# Patient Record
Sex: Male | Born: 1950 | Hispanic: No | Marital: Married | Smoking: Former smoker
Health system: Southern US, Community
[De-identification: ages and names within clinical notes are randomized; demographics above are authoritative.]

## PROBLEM LIST (undated history)

## (undated) DIAGNOSIS — C801 Malignant (primary) neoplasm, unspecified: Secondary | ICD-10-CM

## (undated) DIAGNOSIS — C61 Malignant neoplasm of prostate: Secondary | ICD-10-CM

---

## 1999-04-20 ENCOUNTER — Emergency Department (HOSPITAL_COMMUNITY): Admission: EM | Admit: 1999-04-20 | Discharge: 1999-04-20 | Payer: Self-pay | Admitting: Internal Medicine

## 2000-02-25 ENCOUNTER — Encounter: Admission: RE | Admit: 2000-02-25 | Discharge: 2000-02-25 | Payer: Self-pay | Admitting: Family Medicine

## 2001-09-30 ENCOUNTER — Encounter: Admission: RE | Admit: 2001-09-30 | Discharge: 2001-09-30 | Payer: Self-pay | Admitting: Otolaryngology

## 2001-09-30 ENCOUNTER — Encounter: Payer: Self-pay | Admitting: Otolaryngology

## 2003-12-16 ENCOUNTER — Emergency Department (HOSPITAL_COMMUNITY): Admission: EM | Admit: 2003-12-16 | Discharge: 2003-12-16 | Payer: Self-pay | Admitting: *Deleted

## 2011-01-14 ENCOUNTER — Emergency Department (HOSPITAL_COMMUNITY)
Admission: EM | Admit: 2011-01-14 | Discharge: 2011-01-14 | Disposition: A | Discharge status: Home / Self Care | Payer: PRIVATE HEALTH INSURANCE | Attending: Emergency Medicine | Admitting: Emergency Medicine

## 2011-01-14 DIAGNOSIS — M109 Gout, unspecified: Secondary | ICD-10-CM | POA: Insufficient documentation

## 2011-01-14 DIAGNOSIS — Z79899 Other long term (current) drug therapy: Secondary | ICD-10-CM | POA: Insufficient documentation

## 2012-02-13 ENCOUNTER — Other Ambulatory Visit: Payer: Self-pay | Admitting: Internal Medicine

## 2012-02-14 ENCOUNTER — Other Ambulatory Visit: Payer: Self-pay | Admitting: Internal Medicine

## 2012-02-15 ENCOUNTER — Telehealth: Payer: Self-pay

## 2012-02-15 DIAGNOSIS — R454 Irritability and anger: Secondary | ICD-10-CM

## 2012-02-15 NOTE — Telephone Encounter (Signed)
.  [facility] RANDY JOHNSTON CALLED TO ASK DR. DOOLITTLE IF THIS PATIENT'S MEDICATION FROM THE PSYCHOLOGIST HAS BEEN CALLED INTO THE PHARMACY. HE SAID HE IS A FRIEND OF DR. DOOLITTLE'S AND THAT DR. Merla Riches KNOWS WHAT MEDICATION HE IS TALKING ABOUT. BEST PHONE [phone] (RANDY JOHNSTON)  MBC

## 2012-02-16 DIAGNOSIS — R454 Irritability and anger: Secondary | ICD-10-CM | POA: Insufficient documentation

## 2012-02-16 DIAGNOSIS — M109 Gout, unspecified: Secondary | ICD-10-CM | POA: Insufficient documentation

## 2012-02-16 MED ORDER — FLUOXETINE HCL 20 MG PO TABS: 20.0000 mg | ORAL_TABLET | Freq: Every day | ORAL | Status: DC
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
## 2012-05-13 ENCOUNTER — Ambulatory Visit: Payer: Self-pay

## 2012-05-13 ENCOUNTER — Ambulatory Visit: Payer: Self-pay | Admitting: Family Medicine

## 2012-05-13 VITALS — BP 118/75 | HR 52 | Temp 98.7°F | Resp 16 | Ht 72.0 in | Wt 192.0 lb

## 2012-05-13 DIAGNOSIS — M109 Gout, unspecified: Secondary | ICD-10-CM

## 2012-05-13 DIAGNOSIS — M7989 Other specified soft tissue disorders: Secondary | ICD-10-CM

## 2012-05-13 MED ORDER — INDOMETHACIN 50 MG PO CAPS: 50.0000 mg | ORAL_CAPSULE | Freq: Three times a day (TID) | ORAL | Status: AC

## 2012-05-13 NOTE — Progress Notes (Signed)
  Subjective:    Patient ID: Kyle Randall, male    DOB: 22-Jan-1951, 61 y.o.   MRN: [id number]  HPI Kyle Randall is a 61 y.o. male R hand swelling for past week.  Improved some until using it to unload tile, and then again this am when cutting up stump today - more swelling.  Does remember hitting the back part of the hand on a ladder while cranking a chansaw.  Hx of gout in hands, shoulders, legs, big toe.  Last gout flair a year ago. Usually takes colchicine, and arthrotec. No fever.  Tx: goody powder - about 2x/day.  No hx PUD, no history of GI upset with   Had pinto beans 3 days ago, and meat and beer recently that may have made worse.    Review of Systems  Constitutional: Negative for fever and chills.  Musculoskeletal: Positive for myalgias, joint swelling and arthralgias.  Skin: Negative for color change.       Objective:   Physical Exam  Constitutional: He appears well-developed and well-nourished.  HENT:  Head: Normocephalic and atraumatic.  Pulmonary/Chest: Effort normal.  Musculoskeletal:       Hands:      Sts, ttp over distal 3rd-4th mt's to MTP.  Skin intact. Slight decreased flexion of grasp, full extension.   Skin: Skin is warm and dry.       Slight warmth with sts over dorsal R hand.     [facility] reading (PRIMARY) by  Dr. Neva Seat:  No fx, sts dorsally.     Assessment & Plan:  Kyle Randall is a 61 y.o. male 1. Swelling of hand  DG Hand 2 View Right  2. Gout  DG Hand 2 View Right   Trial of indomethacin 50mg  tid prn with food.  Wean dose as able.  Up to date for info on gout and avoidance of triggers.  Plan on recheck ov this fall for uric acid testing.

## 2012-05-13 NOTE — Patient Instructions (Signed)
To look up more info on your condition, go to the website [email or web address], then on patient resources - select UPTODATE. Under patient resources, select Gout. Return to the clinic or go to the nearest emergency room if any of your symptoms worsen or new symptoms occur.

## 2013-08-11 ENCOUNTER — Other Ambulatory Visit: Payer: Self-pay | Admitting: Internal Medicine

## 2015-10-16 ENCOUNTER — Encounter: Payer: Self-pay | Admitting: Internal Medicine

## 2016-11-05 ENCOUNTER — Observation Stay (HOSPITAL_COMMUNITY): Payer: Medicare Other

## 2016-11-05 ENCOUNTER — Emergency Department (HOSPITAL_COMMUNITY): Payer: Medicare Other

## 2016-11-05 ENCOUNTER — Encounter (HOSPITAL_COMMUNITY): Payer: Self-pay | Admitting: Nurse Practitioner

## 2016-11-05 ENCOUNTER — Inpatient Hospital Stay (HOSPITAL_COMMUNITY)
Admission: EM | Admit: 2016-11-05 | Discharge: 2016-11-07 | DRG: 683 | Disposition: A | Discharge status: Home / Self Care | Payer: Medicare Other | Attending: Internal Medicine | Admitting: Internal Medicine

## 2016-11-05 DIAGNOSIS — K5900 Constipation, unspecified: Secondary | ICD-10-CM | POA: Diagnosis present

## 2016-11-05 DIAGNOSIS — R61 Generalized hyperhidrosis: Secondary | ICD-10-CM | POA: Diagnosis present

## 2016-11-05 DIAGNOSIS — G893 Neoplasm related pain (acute) (chronic): Secondary | ICD-10-CM | POA: Diagnosis present

## 2016-11-05 DIAGNOSIS — M549 Dorsalgia, unspecified: Secondary | ICD-10-CM

## 2016-11-05 DIAGNOSIS — Z87891 Personal history of nicotine dependence: Secondary | ICD-10-CM

## 2016-11-05 DIAGNOSIS — Z8249 Family history of ischemic heart disease and other diseases of the circulatory system: Secondary | ICD-10-CM

## 2016-11-05 DIAGNOSIS — E860 Dehydration: Secondary | ICD-10-CM | POA: Diagnosis not present

## 2016-11-05 DIAGNOSIS — D696 Thrombocytopenia, unspecified: Secondary | ICD-10-CM | POA: Diagnosis present

## 2016-11-05 DIAGNOSIS — N289 Disorder of kidney and ureter, unspecified: Secondary | ICD-10-CM

## 2016-11-05 DIAGNOSIS — C775 Secondary and unspecified malignant neoplasm of intrapelvic lymph nodes: Secondary | ICD-10-CM | POA: Diagnosis present

## 2016-11-05 DIAGNOSIS — N1330 Unspecified hydronephrosis: Secondary | ICD-10-CM

## 2016-11-05 DIAGNOSIS — C779 Secondary and unspecified malignant neoplasm of lymph node, unspecified: Secondary | ICD-10-CM | POA: Diagnosis present

## 2016-11-05 DIAGNOSIS — Z79818 Long term (current) use of other agents affecting estrogen receptors and estrogen levels: Secondary | ICD-10-CM

## 2016-11-05 DIAGNOSIS — F329 Major depressive disorder, single episode, unspecified: Secondary | ICD-10-CM | POA: Diagnosis present

## 2016-11-05 DIAGNOSIS — D649 Anemia, unspecified: Secondary | ICD-10-CM | POA: Diagnosis present

## 2016-11-05 DIAGNOSIS — N131 Hydronephrosis with ureteral stricture, not elsewhere classified: Secondary | ICD-10-CM | POA: Diagnosis present

## 2016-11-05 DIAGNOSIS — M545 Low back pain, unspecified: Secondary | ICD-10-CM

## 2016-11-05 DIAGNOSIS — R531 Weakness: Secondary | ICD-10-CM | POA: Diagnosis not present

## 2016-11-05 DIAGNOSIS — N179 Acute kidney failure, unspecified: Secondary | ICD-10-CM | POA: Diagnosis not present

## 2016-11-05 DIAGNOSIS — C61 Malignant neoplasm of prostate: Secondary | ICD-10-CM | POA: Diagnosis present

## 2016-11-05 DIAGNOSIS — R52 Pain, unspecified: Secondary | ICD-10-CM

## 2016-11-05 DIAGNOSIS — G8929 Other chronic pain: Secondary | ICD-10-CM | POA: Diagnosis present

## 2016-11-05 DIAGNOSIS — F32A Depression, unspecified: Secondary | ICD-10-CM | POA: Diagnosis present

## 2016-11-05 HISTORY — DX: Malignant (primary) neoplasm, unspecified: C80.1

## 2016-11-05 HISTORY — DX: Malignant neoplasm of prostate: C61

## 2016-11-05 LAB — URINALYSIS, ROUTINE W REFLEX MICROSCOPIC
BILIRUBIN URINE: NEGATIVE
Glucose, UA: NEGATIVE mg/dL
HGB URINE DIPSTICK: NEGATIVE
KETONES UR: NEGATIVE mg/dL
LEUKOCYTES UA: NEGATIVE
NITRITE: NEGATIVE
PH: 5 (ref 5.0–8.0)
Protein, ur: NEGATIVE mg/dL
SPECIFIC GRAVITY, URINE: 1.008 (ref 1.005–1.030)

## 2016-11-05 LAB — IRON AND TIBC
IRON: 42 ug/dL — AB (ref 45–182)
SATURATION RATIOS: 11 % — AB (ref 17.9–39.5)
TIBC: 365 ug/dL (ref 250–450)
UIBC: 323 ug/dL

## 2016-11-05 LAB — CBC WITH DIFFERENTIAL/PLATELET
BASOS PCT: 0 %
Basophils Absolute: 0 10*3/uL (ref 0.0–0.1)
EOS ABS: 0 10*3/uL (ref 0.0–0.7)
Eosinophils Relative: 1 %
HEMATOCRIT: 30.5 % — AB (ref 39.0–52.0)
HEMOGLOBIN: 10.7 g/dL — AB (ref 13.0–17.0)
LYMPHS ABS: 1.8 10*3/uL (ref 0.7–4.0)
Lymphocytes Relative: 35 %
MCH: 29.5 pg (ref 26.0–34.0)
MCHC: 35.1 g/dL (ref 30.0–36.0)
MCV: 84 fL (ref 78.0–100.0)
MONO ABS: 0.4 10*3/uL (ref 0.1–1.0)
MONOS PCT: 7 %
NEUTROS ABS: 3 10*3/uL (ref 1.7–7.7)
Neutrophils Relative %: 57 %
Platelets: 128 10*3/uL — ABNORMAL LOW (ref 150–400)
RBC: 3.63 MIL/uL — ABNORMAL LOW (ref 4.22–5.81)
RDW: 16.2 % — AB (ref 11.5–15.5)
WBC: 5.2 10*3/uL (ref 4.0–10.5)

## 2016-11-05 LAB — SODIUM, URINE, RANDOM: Sodium, Ur: 40 mmol/L

## 2016-11-05 LAB — COMPREHENSIVE METABOLIC PANEL
ALBUMIN: 4 g/dL (ref 3.5–5.0)
ALK PHOS: 46 U/L (ref 38–126)
ALT: 31 U/L (ref 17–63)
ANION GAP: 8 (ref 5–15)
AST: 28 U/L (ref 15–41)
BILIRUBIN TOTAL: 0.6 mg/dL (ref 0.3–1.2)
BUN: 29 mg/dL — ABNORMAL HIGH (ref 6–20)
CALCIUM: 9.2 mg/dL (ref 8.9–10.3)
CO2: 23 mmol/L (ref 22–32)
Chloride: 106 mmol/L (ref 101–111)
Creatinine, Ser: 2.05 mg/dL — ABNORMAL HIGH (ref 0.61–1.24)
GFR calc non Af Amer: 32 mL/min — ABNORMAL LOW (ref >60)
GFR, EST AFRICAN AMERICAN: 37 mL/min — AB (ref >60)
GLUCOSE: 103 mg/dL — AB (ref 65–99)
POTASSIUM: 4.1 mmol/L (ref 3.5–5.1)
Sodium: 137 mmol/L (ref 135–145)
TOTAL PROTEIN: 7.7 g/dL (ref 6.5–8.1)

## 2016-11-05 LAB — VITAMIN B12: Vitamin B-12: 624 pg/mL (ref 180–914)

## 2016-11-05 LAB — RETICULOCYTES
RBC.: 3.6 MIL/uL — ABNORMAL LOW (ref 4.22–5.81)
Retic Count, Absolute: 54 10*3/uL (ref 19.0–186.0)
Retic Ct Pct: 1.5 % (ref 0.4–3.1)

## 2016-11-05 LAB — FOLATE: Folate: 31.2 ng/mL (ref >5.9)

## 2016-11-05 LAB — LIPASE, BLOOD: Lipase: 26 U/L (ref 11–51)

## 2016-11-05 LAB — CREATININE, URINE, RANDOM: Creatinine, Urine: 88.79 mg/dL

## 2016-11-05 LAB — FERRITIN: FERRITIN: 290 ng/mL (ref 24–336)

## 2016-11-05 MED ORDER — CYCLOBENZAPRINE HCL 10 MG PO TABS: 10.0000 mg | ORAL_TABLET | Freq: Three times a day (TID) | ORAL | Status: DC | PRN

## 2016-11-05 MED ORDER — SODIUM CHLORIDE 0.9 % IV SOLN: INTRAVENOUS | Status: DC

## 2016-11-05 MED ORDER — ACETAMINOPHEN 650 MG RE SUPP: 650.0000 mg | Freq: Four times a day (QID) | RECTAL | Status: DC | PRN

## 2016-11-05 MED ORDER — ONDANSETRON HCL 4 MG PO TABS: 4.0000 mg | ORAL_TABLET | Freq: Four times a day (QID) | ORAL | Status: DC | PRN

## 2016-11-05 MED ORDER — ACETAMINOPHEN 325 MG PO TABS: 650.0000 mg | ORAL_TABLET | Freq: Four times a day (QID) | ORAL | Status: DC | PRN

## 2016-11-05 MED ORDER — POLYETHYLENE GLYCOL 3350 17 G PO PACK: 17.0000 g | PACK | Freq: Every day | ORAL | Status: DC | PRN

## 2016-11-05 MED ORDER — ONDANSETRON HCL 4 MG/2ML IJ SOLN: 4.0000 mg | Freq: Four times a day (QID) | INTRAMUSCULAR | Status: DC | PRN

## 2016-11-05 MED ORDER — MAGNESIUM CITRATE PO SOLN: 1.0000 | Freq: Once | ORAL | Status: DC | PRN

## 2016-11-05 MED ORDER — SODIUM CHLORIDE 0.9 % IV SOLN
INTRAVENOUS | Status: AC
  Administered 2016-11-05: 1000 mL via INTRAVENOUS

## 2016-11-05 MED ORDER — HYDROCODONE-ACETAMINOPHEN 5-325 MG PO TABS
1.0000 | ORAL_TABLET | ORAL | Status: DC | PRN
  Administered 2016-11-06: 2 via ORAL
  Filled 2016-11-05: qty 2

## 2016-11-05 MED ORDER — BISACODYL 5 MG PO TBEC: 5.0000 mg | DELAYED_RELEASE_TABLET | Freq: Every day | ORAL | Status: DC | PRN

## 2016-11-05 MED ORDER — SODIUM CHLORIDE 0.9 % IV SOLN
INTRAVENOUS | Status: DC
  Administered 2016-11-05: 1000 mL via INTRAVENOUS

## 2016-11-05 MED ORDER — LORAZEPAM 2 MG/ML IJ SOLN
0.5000 mg | Freq: Once | INTRAMUSCULAR | Status: AC
  Administered 2016-11-05: 0.5 mg via INTRAVENOUS
  Filled 2016-11-05: qty 1

## 2016-11-05 MED ORDER — GABAPENTIN 300 MG PO CAPS: 300.0000 mg | ORAL_CAPSULE | Freq: Three times a day (TID) | ORAL | Status: DC | PRN

## 2016-11-05 MED ORDER — TAMSULOSIN HCL 0.4 MG PO CAPS
0.4000 mg | ORAL_CAPSULE | Freq: Every day | ORAL | Status: DC
  Administered 2016-11-05 – 2016-11-06 (×2): 0.4 mg via ORAL
  Filled 2016-11-05 (×2): qty 1

## 2016-11-05 MED ORDER — HEPARIN SODIUM (PORCINE) 5000 UNIT/ML IJ SOLN
5000.0000 [IU] | Freq: Three times a day (TID) | INTRAMUSCULAR | Status: DC
  Administered 2016-11-05 – 2016-11-07 (×5): 5000 [IU] via SUBCUTANEOUS
  Filled 2016-11-05 (×5): qty 1

## 2016-11-05 MED ORDER — HYDROMORPHONE HCL 1 MG/ML IJ SOLN
1.0000 mg | Freq: Once | INTRAMUSCULAR | Status: AC
  Administered 2016-11-05: 1 mg via INTRAVENOUS
  Filled 2016-11-05: qty 1

## 2016-11-05 MED ORDER — SODIUM CHLORIDE 0.9 % IV BOLUS (SEPSIS)
2000.0000 mL | Freq: Once | INTRAVENOUS | Status: AC
Start: 2016-11-05 — End: 2016-11-05
  Administered 2016-11-05: 2000 mL via INTRAVENOUS

## 2016-11-05 MED ORDER — HYDROMORPHONE HCL 2 MG/ML IJ SOLN
0.5000 mg | INTRAMUSCULAR | Status: DC | PRN
  Administered 2016-11-05 – 2016-11-07 (×6): 1 mg via INTRAVENOUS
  Filled 2016-11-05 (×6): qty 1

## 2016-11-05 MED ORDER — SODIUM CHLORIDE 0.9 % IV BOLUS (SEPSIS)
2000.0000 mL | Freq: Once | INTRAVENOUS | Status: AC
  Administered 2016-11-05: 2000 mL via INTRAVENOUS

## 2016-11-05 MED ORDER — HYDRALAZINE HCL 20 MG/ML IJ SOLN: 5.0000 mg | INTRAMUSCULAR | Status: DC | PRN

## 2016-11-05 NOTE — ED Notes (Signed)
Sig stated "he has prostate cancer but metastasized to lymph nodes."

## 2016-11-05 NOTE — ED Triage Notes (Signed)
Patient complains of nausea vomiting and dehydration and back pain. History of prostate cancer and receives hormone therapy. With the hormone therapy he usually sweats profusely and is nauseous but today it has been worse. CBG 322

## 2016-11-05 NOTE — ED Provider Notes (Signed)
[facility] DEPT Provider Note   CSN: [id number] Arrival date & time: 11/05/16  1507     History   Chief Complaint No chief complaint on file.   HPI Kyle Randall is a 65 y.o. male.  65 year old male with a history of metastatic prostate cancer currently receiving hormone replacement therapy presents with worsening chronic left flank pain which is characterized as sharp and not associated with urinary symptoms. Saw his doctor for similar symptoms and was prescribed NSAIDs as well as tramadol. Patient's prostate cancers metastasized to his lymph nodes but not to his spine. States that he had imaging of his spine 2 months ago which confirm this. He denies any bowel or bladder dysfunction. States the pain is sharp and located to the left flank with some radiation to the leg but denies any foot drop. No recent fever, vomiting. Patient states he has had periods of diaphoresis due to his hormone therapy and feels that he is dehydrated. He denies any syncope or near-syncope.      No past medical history on file.  Patient Active Problem List   Diagnosis Date Noted  . Outbursts of anger 02/16/2012  . Gout 02/16/2012    No past surgical history on file.     Home Medications    Prior to Admission medications   Medication Sig Start Date End Date Taking? Authorizing Provider  FLUoxetine (PROZAC) 20 MG tablet Take 1 tablet (20 mg total) by mouth daily. 02/16/12 05/16/12  Leandrew Koyanagi, MD    Family History No family history on file.  Social History Social History  Substance Use Topics  . Smoking status: Former Research scientist (life sciences)  . Smokeless tobacco: Not on file  . Alcohol use Not on file     Allergies   Patient has no known allergies.   Review of Systems Review of Systems  All other systems reviewed and are negative.    Physical Exam Updated Vital Signs BP (!) 158/102 (BP Location: Right Arm) Comment: pt in intense pain currently  Pulse 75   Temp 97.7 F (36.5 C)  (Oral)   Resp 26   Ht 6\' 1"  (1.854 m)   Wt 88.5 kg   SpO2 93%   BMI 25.73 kg/m   Physical Exam  Constitutional: He is oriented to person, place, and time. He appears well-developed and well-nourished.  Non-toxic appearance. No distress.  HENT:  Head: Normocephalic and atraumatic.  Eyes: Conjunctivae, EOM and lids are normal. Pupils are equal, round, and reactive to light.  Neck: Normal range of motion. Neck supple. No tracheal deviation present. No thyroid mass present.  Cardiovascular: Normal rate, regular rhythm and normal heart sounds.  Exam reveals no gallop.   No murmur heard. Pulmonary/Chest: Effort normal and breath sounds normal. No stridor. No respiratory distress. He has no decreased breath sounds. He has no wheezes. He has no rhonchi. He has no rales.  Abdominal: Soft. Normal appearance and bowel sounds are normal. He exhibits no distension. There is no tenderness. There is no rebound and no CVA tenderness.  Musculoskeletal: Normal range of motion. He exhibits no edema or tenderness.       Back:  Neurological: He is alert and oriented to person, place, and time. He has normal strength. No cranial nerve deficit or sensory deficit. GCS eye subscore is 4. GCS verbal subscore is 5. GCS motor subscore is 6.  Skin: Skin is warm and dry. No abrasion and no rash noted.  Psychiatric: He has a normal mood and affect.  His speech is normal and behavior is normal.  Nursing note and vitals reviewed.    ED Treatments / Results  Labs (all labs ordered are listed, but only abnormal results are displayed) Labs Reviewed - No data to display  EKG  EKG Interpretation None       Radiology No results found.  Procedures Procedures (including critical care time)  Medications Ordered in ED Medications - No data to display   Initial Impression / Assessment and Plan / ED Course  I have reviewed the triage vital signs and the nursing notes.  Pertinent labs & imaging results that  were available during my care of the patient were reviewed by me and considered in my medical decision making (see chart for details).  Clinical Course     Patient admits to profound diaphoresis and fluid loss. Relates this to taking hormone replacement therapy for his prostate cancer treatment. States he has had profound weakness. Denies any vomiting or diarrhea. Has evidence of acute kidney injury. We'll IV hydrate here and admitted to the hospitalist for management  Final Clinical Impressions(s) / ED Diagnoses   Final diagnoses:  None    New Prescriptions New Prescriptions   No medications on file     Lacretia Leigh, MD 11/05/16 1830

## 2016-11-05 NOTE — ED Notes (Signed)
In and out cath preformed by NT Marin Olp

## 2016-11-05 NOTE — ED Notes (Signed)
Patient has been encouraged to give urine sample with explanation as to why it is needed. Urinal at bedside.

## 2016-11-05 NOTE — ED Notes (Signed)
Patient attempted to urinate, but was not successful per patient family member.

## 2016-11-05 NOTE — H&P (Signed)
History and Physical    [name] LZJ:[id number] DOB: 02-15-1951 DOA: 11/05/2016  PCP: Leandrew Koyanagi, MD (Inactive)   Patient coming from: Home  Chief Complaint: Nausea, vomiting, sweats, dehydration, low back pain  HPI: Kyle Randall is a 65 y.o. male with medical history significant for prostate cancer with metastasis to lymph node and cancer related pain who presents to the emergency department with nausea, vomiting, diaphoresis, and worsening in his chronic low back pain. Patient reports being under the care of [facility] and is managed with a hormonal agonist for his prostate cancer. He reports that this treatment has consistently caused sweating and nausea, but reports that today has been much worse. Patient reports feeling nauseous and with worsening back pain yesterday, and then today experienced significant worsening with nonbloody nonbilious vomiting and profuse sweating. He denies any fevers, cough, chest pain, or palpitations. He denies diarrhea, melena, or hematochezia, but endorses constipation. He was seen for the worsening low back pain by his physician at the [place] 2 months ago and reports that imaging of the lumbar spine at that time was negative for new metastasis. He was prescribed tramadol and mobile for his pain which had been helpful initially, but now not providing any relief. Patient denies any history of kidney disease, but notes that he has not urinated much in the past day.  ED Course: Upon arrival to the ED, patient is found to be afebrile, saturating well on room air, and with vital signs stable. Chemistry panel is notable for a BUN of 29 and serum creatinine of 2.05 with no prior available for comparison. CBC features a normocytic anemia with hemoglobin of 10.7 and a mild thrombocytopenia with platelets 128,000. Radiographs of the lumbar spine were obtained and notable for degenerative disc disease at L4-5 and L5-S1, but no new compression fractures or  bony lesions. Patient was given 4 L of normal saline in the emergency department and his pain was treated with Dilaudid. Patient remained hemodynamically stable in the ED and will be observed on the medical surgical unit for ongoing evaluation and management of nausea, vomiting, and diaphoresis with resulting kidney injury, suspected secondary to the patient's antineoplastic therapy.  Review of Systems:  All other systems reviewed and apart from HPI, are negative.  Past Medical History:  Diagnosis Date  . Cancer [facility])    Prostate  . Prostate cancer, primary, with metastasis from prostate to other site [facility])     History reviewed. No pertinent surgical history.   reports that he has quit smoking. He has never used smokeless tobacco. He reports that he uses drugs, including Marijuana. He reports that he does not drink alcohol.  No Known Allergies  History reviewed. No pertinent family history.   Prior to Admission medications   Medication Sig Start Date End Date Taking? Authorizing Provider  cyclobenzaprine (FLEXERIL) 10 MG tablet Take 10 mg by mouth every 8 (eight) hours as needed for muscle spasms.   Yes Historical Provider, MD  gabapentin (NEURONTIN) 300 MG capsule Take 300 mg by mouth 3 (three) times daily as needed (pain).   Yes Historical Provider, MD  meloxicam (MOBIC) 15 MG tablet Take 15 mg by mouth daily.   Yes Historical Provider, MD  propantheline (PROBANTHINE) 15 MG tablet Take 15 mg by mouth 3 (three) times daily with meals.   Yes Historical Provider, MD  tamsulosin (FLOMAX) 0.4 MG CAPS capsule Take 0.4 mg by mouth at bedtime.   Yes Historical Provider, MD  traMADol Veatrice Bourbon) 50  MG tablet Take 50 mg by mouth every 6 (six) hours as needed for moderate pain.   Yes Historical Provider, MD  FLUoxetine (PROZAC) 20 MG tablet Take 1 tablet (20 mg total) by mouth daily. Patient not taking: Reported on 11/05/2016 02/16/12 05/16/12  Leandrew Koyanagi, MD    Physical Exam: Vitals:    11/05/16 1518 11/05/16 1519 11/05/16 1732  BP: 139/69 (!) 158/102 110/77  Pulse: 66 75 82  Resp: 18 26 20   Temp:  97.7 F (36.5 C)   TempSrc:  Oral   SpO2: 98% 93% 97%  Weight: 88.5 kg (195 lb) 88.5 kg (195 lb)   Height: 6' 1"  (1.854 m) 6' 1"  (1.854 m)       Constitutional: NAD, calm, appears uncomfortable  Eyes: PERTLA, lids and conjunctivae normal ENMT: Mucous membranes are dry. Posterior pharynx clear of any exudate or lesions.   Neck: normal, supple, no masses, no thyromegaly Respiratory: clear to auscultation bilaterally, no wheezing, no crackles. Normal respiratory effort.    Cardiovascular: S1 & S2 heard, regular rate and rhythm. No extremity edema. No significant JVD. Abdomen: No distension, soft, mild generalized tenderness without rebound pain or guarding, no masses palpated. Bowel sounds active.  Musculoskeletal: no clubbing / cyanosis. No joint deformity upper and lower extremities. Normal muscle tone.  Skin: no significant rashes, lesions, ulcers. Warm, dry, well-perfused. Poor turgor.  Neurologic: CN 2-12 grossly intact. Sensation intact, DTR normal. Strength 5/5 in all 4 limbs.  Psychiatric: Normal judgment and insight. Alert and oriented x 3. Normal mood and affect.     Labs on Admission: I have personally reviewed following labs and imaging studies  CBC:  Recent Labs Lab 11/05/16 1606  WBC 5.2  NEUTROABS 3.0  HGB 10.7*  HCT 30.5*  MCV 84.0  PLT 128*   Basic Metabolic Panel:  Recent Labs Lab 11/05/16 1606  NA 137  K 4.1  CL 106  CO2 23  GLUCOSE 103*  BUN 29*  CREATININE 2.05*  CALCIUM 9.2   GFR: Estimated Creatinine Clearance: 40.6 mL/min (by C-G formula based on SCr of 2.05 mg/dL (H)). Liver Function Tests:  Recent Labs Lab 11/05/16 1606  AST 28  ALT 31  ALKPHOS 46  BILITOT 0.6  PROT 7.7  ALBUMIN 4.0    Recent Labs Lab 11/05/16 1606  LIPASE 26   No results for input(s): AMMONIA in the last 168 hours. Coagulation  Profile: No results for input(s): INR, PROTIME in the last 168 hours. Cardiac Enzymes: No results for input(s): CKTOTAL, CKMB, CKMBINDEX, TROPONINI in the last 168 hours. BNP (last 3 results) No results for input(s): PROBNP in the last 8760 hours. HbA1C: No results for input(s): HGBA1C in the last 72 hours. CBG: No results for input(s): GLUCAP in the last 168 hours. Lipid Profile: No results for input(s): CHOL, HDL, LDLCALC, TRIG, CHOLHDL, LDLDIRECT in the last 72 hours. Thyroid Function Tests: No results for input(s): TSH, T4TOTAL, FREET4, T3FREE, THYROIDAB in the last 72 hours. Anemia Panel: No results for input(s): VITAMINB12, FOLATE, FERRITIN, TIBC, IRON, RETICCTPCT in the last 72 hours. Urine analysis:    Component Value Date/Time   COLORURINE STRAW (A) 11/05/2016 1600   APPEARANCEUR CLEAR 11/05/2016 1600   LABSPEC 1.008 11/05/2016 1600   PHURINE 5.0 11/05/2016 1600   GLUCOSEU NEGATIVE 11/05/2016 1600   HGBUR NEGATIVE 11/05/2016 1600   BILIRUBINUR NEGATIVE 11/05/2016 1600   KETONESUR NEGATIVE 11/05/2016 1600   PROTEINUR NEGATIVE 11/05/2016 1600   NITRITE NEGATIVE 11/05/2016 1600   LEUKOCYTESUR  NEGATIVE 11/05/2016 1600   Sepsis Labs: @LABRCNTIP (procalcitonin:4,lacticidven:4) )No results found for this or any previous visit (from the past 240 hour(s)).   Radiological Exams on Admission: Dg Lumbar Spine Complete  Result Date: 11/05/2016 CLINICAL DATA:  Nausea vomiting and back pain. History of prostate malignancy treated with hormonal therapy onset of low back pain today without known injury. EXAM: LUMBAR SPINE - COMPLETE 4+ VIEW COMPARISON:  None in PACs FINDINGS: The lumbar vertebral bodies are preserved in height. There is mild grade 1 anterolisthesis of L4 with respect L5. No pars defects are visible. There is moderate degenerative disc space narrowing at L4-5. There is mild disc space narrowing at L5-S1. There is facet joint hypertrophy at L4-5 and L5-S1. The pedicles  and transverse processes are intact. The observed portions of the sacrum are normal. 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 Facet joint hypertrophy at L4-5 and L5-S1 as well. There is no compression fracture nor lytic or blastic bony lesion. Electronically Signed   By: David  Martinique M.D.   On: 11/05/2016 16:45    EKG: Not performed, will obtain as appropriate.   Assessment/Plan  1. Dehydration  - Pt reports frequent nausea and diaphoresis with his hormonal prostate CA tx, but has been worse last couple days with vomiting the day of admission  - He was given 4 L of NS in the ED and will be continued on a gentle IV normal saline infusion over night - Continue prn antiemetics, monitor and correct electrolytes prn   2. Kidney disease of uncertain chronicity  - No prior labs available at time of admission; pt reports having blood drawn in October 2017 at [facility]  - SCr is 2.05 on admission with BUN 29 - Suspect this is acute as pt does not recall ever been told of renal impairment, and he was started on scheduled NSAID at his last [place] office visit 2 mos ago   - Pt has been fluid-resuscitated in the ED as above; plan to hold NSAID, continue gentle IVF, follow strict I/Os, check renal US, obtain urine studies, and repeat chemistries in the am    3. Prostate cancer with nodal metastasis - Pt receives care through the [place] and has been managed with a hormonal agonist  - He reports being evaluated in October for worsening back pain with imaging that was negative for new metastasis at that time - Pt has follow-up scheduled at [facility] on 11/21/15    4. Acute on chronic low back pain  - Pt reports longstanding LBP, worsening over the past 2 mos or more, and started on Mobic and tramadol in October  - Mobic and tramadol provided adequate relief initially, but not for past couple weeks; pt in obvious discomfort on arrival  - Radiographs of the lumber  spine obtained in ED feature DDD at L4-5 and L5-S1, but no fracture or met - There is no LE weakness, saddle anesthesia, incontinence, or urinary retention  - He was treated with IV Dilaudid in ED with good relief; will try to control with oral analgesics, but IV Dilaudid available for severe pain     5. Normocytic anemia, thrombocytopenia  - Hgb is 10.7 and platelets 128,000 on admission with no priors for comparison  - Pt denies melena, hematochezia, hematuria, easy bruising or bleeding, or rash  - Uncertain etiology and uncertain chronicity - Check anemia panel; do not anticipate transfusion need  DVT prophylaxis: sq heparin Code  Status: Full  Family Communication: Significant other updated at bedside at patient's request Disposition Plan: Observe on med-surg Consults called: None Admission status: Observation    Vianne Bulls, MD [facility] Pager [phone]  If 7PM-7AM, please contact night-coverage [email or web address] Password [email or web address]  11/05/2016, 7:05 PM

## 2016-11-06 ENCOUNTER — Observation Stay (HOSPITAL_COMMUNITY): Payer: Medicare Other

## 2016-11-06 ENCOUNTER — Encounter (HOSPITAL_COMMUNITY): Payer: Self-pay | Admitting: Radiology

## 2016-11-06 DIAGNOSIS — Z8249 Family history of ischemic heart disease and other diseases of the circulatory system: Secondary | ICD-10-CM | POA: Diagnosis not present

## 2016-11-06 DIAGNOSIS — Z79818 Long term (current) use of other agents affecting estrogen receptors and estrogen levels: Secondary | ICD-10-CM | POA: Diagnosis not present

## 2016-11-06 DIAGNOSIS — G893 Neoplasm related pain (acute) (chronic): Secondary | ICD-10-CM | POA: Diagnosis not present

## 2016-11-06 DIAGNOSIS — D696 Thrombocytopenia, unspecified: Secondary | ICD-10-CM | POA: Diagnosis present

## 2016-11-06 DIAGNOSIS — R531 Weakness: Secondary | ICD-10-CM | POA: Diagnosis present

## 2016-11-06 DIAGNOSIS — G8929 Other chronic pain: Secondary | ICD-10-CM | POA: Diagnosis present

## 2016-11-06 DIAGNOSIS — Z87891 Personal history of nicotine dependence: Secondary | ICD-10-CM | POA: Diagnosis not present

## 2016-11-06 DIAGNOSIS — C779 Secondary and unspecified malignant neoplasm of lymph node, unspecified: Secondary | ICD-10-CM | POA: Diagnosis present

## 2016-11-06 DIAGNOSIS — R52 Pain, unspecified: Secondary | ICD-10-CM

## 2016-11-06 DIAGNOSIS — C61 Malignant neoplasm of prostate: Secondary | ICD-10-CM | POA: Diagnosis present

## 2016-11-06 DIAGNOSIS — E860 Dehydration: Secondary | ICD-10-CM | POA: Diagnosis present

## 2016-11-06 DIAGNOSIS — N179 Acute kidney failure, unspecified: Secondary | ICD-10-CM | POA: Diagnosis present

## 2016-11-06 DIAGNOSIS — D649 Anemia, unspecified: Secondary | ICD-10-CM | POA: Diagnosis present

## 2016-11-06 DIAGNOSIS — N1330 Unspecified hydronephrosis: Secondary | ICD-10-CM | POA: Diagnosis not present

## 2016-11-06 DIAGNOSIS — N131 Hydronephrosis with ureteral stricture, not elsewhere classified: Secondary | ICD-10-CM | POA: Diagnosis present

## 2016-11-06 DIAGNOSIS — K5900 Constipation, unspecified: Secondary | ICD-10-CM | POA: Diagnosis present

## 2016-11-06 DIAGNOSIS — N289 Disorder of kidney and ureter, unspecified: Secondary | ICD-10-CM | POA: Diagnosis present

## 2016-11-06 DIAGNOSIS — R61 Generalized hyperhidrosis: Secondary | ICD-10-CM | POA: Diagnosis present

## 2016-11-06 DIAGNOSIS — M545 Low back pain: Secondary | ICD-10-CM | POA: Diagnosis present

## 2016-11-06 LAB — BASIC METABOLIC PANEL
Anion gap: 5 (ref 5–15)
BUN: 27 mg/dL — ABNORMAL HIGH (ref 6–20)
CHLORIDE: 106 mmol/L (ref 101–111)
CO2: 25 mmol/L (ref 22–32)
CREATININE: 2.05 mg/dL — AB (ref 0.61–1.24)
Calcium: 9.5 mg/dL (ref 8.9–10.3)
GFR calc non Af Amer: 32 mL/min — ABNORMAL LOW (ref >60)
GFR, EST AFRICAN AMERICAN: 37 mL/min — AB (ref >60)
Glucose, Bld: 104 mg/dL — ABNORMAL HIGH (ref 65–99)
Potassium: 5.3 mmol/L — ABNORMAL HIGH (ref 3.5–5.1)
Sodium: 136 mmol/L (ref 135–145)

## 2016-11-06 LAB — SURGICAL PCR SCREEN
MRSA, PCR: NEGATIVE
STAPHYLOCOCCUS AUREUS: NEGATIVE

## 2016-11-06 MED ORDER — ORAL CARE MOUTH RINSE
15.0000 mL | Freq: Two times a day (BID) | OROMUCOSAL | Status: DC
  Administered 2016-11-06 (×2): 15 mL via OROMUCOSAL

## 2016-11-06 MED ORDER — MEGESTROL ACETATE 20 MG PO TABS
20.0000 mg | ORAL_TABLET | Freq: Two times a day (BID) | ORAL | Status: DC
  Administered 2016-11-06 – 2016-11-07 (×2): 20 mg via ORAL
  Filled 2016-11-06 (×2): qty 1

## 2016-11-06 MED ORDER — SODIUM CHLORIDE 0.9 % IV SOLN
INTRAVENOUS | Status: DC
  Administered 2016-11-06: 19:00:00 via INTRAVENOUS

## 2016-11-06 MED ORDER — CEFAZOLIN SODIUM-DEXTROSE 2-4 GM/100ML-% IV SOLN
2.0000 g | INTRAVENOUS | Status: AC
  Administered 2016-11-07: 2 g via INTRAVENOUS

## 2016-11-06 MED ORDER — SODIUM CHLORIDE 0.9 % IV SOLN: INTRAVENOUS | Status: DC

## 2016-11-06 NOTE — Progress Notes (Signed)
PROGRESS NOTE  Yoan Sallade QAS:[id number] DOB: Jan 28, 1951 DOA: 11/05/2016 PCP: Leandrew Koyanagi, MD (Inactive)  Brief History:  65 y.o. male with medical history significant for prostate cancer with metastasis to lymph node and cancer related pain who presents to the emergency department with nausea, vomiting, diaphoresis, and worsening in his chronic low back pain. Patient reports being under the care of [facility] and is managed with a hormonal antagonist for his prostate cancer. He reports that this treatment has consistently caused sweating and nausea, but reports that today has been much worse. Patient reports feeling nauseous and with worsening back pain yesterday, and then today experienced significant worsening with nonbloody nonbilious vomiting and profuse sweating. He denies any fevers, cough, chest pain, or palpitations. He denies diarrhea, melena, or hematochezia, but endorses constipation. He was seen for the worsening low back pain by his physician at the [place] 2 months ago and reports that imaging of the lumbar spine at that time was negative for new metastasis.  The patient was admitted and started on intravenous fluids and intravenous hydromorphone for pain control. He received 4 L in the emergency department. Plain films of the lumbar spine were negative for metastasis or compression fracture.  Assessment/Plan:  Dehydration  - Pt reports frequent nausea and diaphoresis with his hormonal prostate CA tx, but has been worse last couple days with vomiting the day of admission  - He was given 4 L of NS in the ED - Continue prn antiemetics, monitor and correct electrolytes prn  - continue IVF  2. Kidney disease of uncertain chronicity  - No prior labs available at time of admission; - no improvement with IVF resuscitation - 12/19 renal US--L-hydronephrosis-->consult placed to urology - d/c meloxicam - FeNa <1%  3. Prostate cancer with nodal metastasis - Pt  receives care through the [place] and has been managed with a hormonal antagonist  - He reports being evaluated in October for worsening back pain with imaging that was negative for new metastasis at that time - Pt has follow-up scheduled at [facility] on 11/21/15    4. Acute on chronic low back pain/Uncontrolled pain - Pt reports longstanding LBP, worsening over the past 2 mos or more, and started on Mobic and tramadol in October  - Mobic and tramadol provided adequate relief initially, but not for past couple weeks; pt was in obvious discomfort on arrival  - Radiographs of the lumber spine obtained in ED feature DDD at L4-5 and L5-S1, but no fracture or met - There is no LE weakness, saddle anesthesia, incontinence, or urinary retention  - MRI L-spine -continue IV hydromorphone  5. Normocytic anemia, thrombocytopenia  - Hgb is 10.7 and platelets 128,000 on admission with no priors for comparison  - Pt denies melena, hematochezia, hematuria, easy bruising or bleeding, or rash  - Uncertain etiology and uncertain chronicity - Iron sat 11%, ferritin 290 -B12--624 -folate 31.2   Disposition Plan:   Home in 1-2 days if no urologic intervention needed  Family Communication:   Family at bedside  Consultants:  Urology  Code Status:  FULL  DVT Prophylaxis:  [place] Heparin / [place] Lovenox   Procedures: As Listed in Progress Note Above  Antibiotics: None    Subjective: Patient states that back pain is low but better with intravenous hydromorphone. Denies any lower extremity weakness, radiculopathy, saddle anesthesia, urinary or bowel incontinence. Denies any fevers, chills, chest pain, shortness breath, nausea, vomiting, diarrhea, abdominal  pain.  Objective: Vitals:   11/05/16 1953 11/05/16 2050 11/05/16 2118 11/06/16 0546  BP: (!) 155/108 (!) 150/107 (!) 150/82 (!) 122/94  Pulse: 76 66 80 77  Resp: 20 16  16   Temp: 98.5 F (36.9 C) 97 F (36.1 C)  98.4 F (36.9 C)  TempSrc: Oral Oral  Oral    SpO2: 100% 100%  100%  Weight:  90.8 kg (200 lb 3.2 oz)  90.8 kg (200 lb 3.2 oz)  Height:  6' (1.829 m)      Intake/Output Summary (Last 24 hours) at 11/06/16 0913 Last data filed at 11/06/16 0220  Gross per 24 hour  Intake                0 ml  Output              900 ml  Net             -900 ml   Weight change:  Exam:   General:  Pt is alert, follows commands appropriately, not in acute distress  HEENT: No icterus, No thrush, No neck mass, [place]/AT  Cardiovascular: RRR, S1/S2, no rubs, no gallops  Respiratory: CTA bilaterally, no wheezing, no crackles, no rhonchi  Abdomen: Soft/+BS, non tender, non distended, no guarding  Extremities: No edema, No lymphangitis, No petechiae, No rashes, no synovitis  Neuro:  CN II-XII intact, strength 4/5 in RUE, RLE, strength 4/5 LUE, LLE; sensation intact bilateral; no dysmetria; babinski equivocal     Data Reviewed: I have personally reviewed following labs and imaging studies Basic Metabolic Panel:  Recent Labs Lab 11/05/16 1606 11/06/16 0417  NA 137 136  K 4.1 5.3*  CL 106 106  CO2 23 25  GLUCOSE 103* 104*  BUN 29* 27*  CREATININE 2.05* 2.05*  CALCIUM 9.2 9.5   Liver Function Tests:  Recent Labs Lab 11/05/16 1606  AST 28  ALT 31  ALKPHOS 46  BILITOT 0.6  PROT 7.7  ALBUMIN 4.0    Recent Labs Lab 11/05/16 1606  LIPASE 26   No results for input(s): AMMONIA in the last 168 hours. Coagulation Profile: No results for input(s): INR, PROTIME in the last 168 hours. CBC:  Recent Labs Lab 11/05/16 1606  WBC 5.2  NEUTROABS 3.0  HGB 10.7*  HCT 30.5*  MCV 84.0  PLT 128*   Cardiac Enzymes: No results for input(s): CKTOTAL, CKMB, CKMBINDEX, TROPONINI in the last 168 hours. BNP: Invalid input(s): POCBNP CBG: No results for input(s): GLUCAP in the last 168 hours. HbA1C: No results for input(s): HGBA1C in the last 72 hours. Urine analysis:    Component Value Date/Time   COLORURINE STRAW (A) 11/05/2016  1600   APPEARANCEUR CLEAR 11/05/2016 1600   LABSPEC 1.008 11/05/2016 1600   PHURINE 5.0 11/05/2016 1600   GLUCOSEU NEGATIVE 11/05/2016 1600   HGBUR NEGATIVE 11/05/2016 1600   BILIRUBINUR NEGATIVE 11/05/2016 1600   KETONESUR NEGATIVE 11/05/2016 1600   PROTEINUR NEGATIVE 11/05/2016 1600   NITRITE NEGATIVE 11/05/2016 1600   LEUKOCYTESUR NEGATIVE 11/05/2016 1600   Sepsis Labs: @LABRCNTIP (procalcitonin:4,lacticidven:4) )No results found for this or any previous visit (from the past 240 hour(s)).   Scheduled Meds: . heparin  5,000 Units Subcutaneous Q8H  . mouth rinse  15 mL Mouth Rinse BID  . tamsulosin  0.4 mg Oral QHS   Continuous Infusions:  Procedures/Studies: Dg Lumbar Spine Complete  Result Date: 11/05/2016 CLINICAL DATA:  Nausea vomiting and back pain. History of prostate malignancy treated with hormonal therapy onset  of low back pain today without known injury. EXAM: LUMBAR SPINE - COMPLETE 4+ VIEW COMPARISON:  None in PACs FINDINGS: The lumbar vertebral bodies are preserved in height. There is mild grade 1 anterolisthesis of L4 with respect L5. No pars defects are visible. There is moderate degenerative disc space narrowing at L4-5. There is mild disc space narrowing at L5-S1. There is facet joint hypertrophy at L4-5 and L5-S1. The pedicles and transverse processes are intact. The observed portions of the sacrum are normal. 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 Facet joint hypertrophy at L4-5 and L5-S1 as well. There is no compression fracture nor lytic or blastic bony lesion. Electronically Signed   By: Tae Robak  [place] M.D.   On: 11/05/2016 16:45   US Renal  Result Date: 11/05/2016 CLINICAL DATA:  Left flank pain for 1 month EXAM: RENAL / URINARY TRACT ULTRASOUND COMPLETE COMPARISON:  None. FINDINGS: Right Kidney: Length: 12.5 cm. Cortical echogenicity within normal limits. Large cyst in the upper pole measuring  6 x 4.2 x 4 cm. Left Kidney: Length: 12.4 cm. Echogenicity within normal limits. Mild hydronephrosis and proximal hydroureter. Bladder: Appears normal for degree of bladder distention. Bilateral ureteral jet visualized. IMPRESSION: 1. Mild hydronephrosis and hydroureter of the left kidney. If 8 ureteral stone is suspected, CT KUB may be obtained. 2. Large 6 cm cyst in the upper pole of the right kidney. Electronically Signed   By: Donavan Foil M.D.   On: 11/05/2016 19:55    Larah Kuntzman, DO  [facility] Pager [phone]  If 7PM-7AM, please contact night-coverage [email or web address] Password [email or web address] 11/06/2016, 9:13 AM   LOS: 0 days

## 2016-11-06 NOTE — Progress Notes (Signed)
Pt arrived on unit.Pt awake,alert and accompanied by his significant other.T-97,P-66 R-16,BP-150/107 PO2 100 on r/a.wbb

## 2016-11-06 NOTE — Progress Notes (Signed)
Nursing Note: Pt was in and out cathed per policy,assisted by NT.Pt tolerated well.Pt was in and out cathed for 900 cc of clear yellow urine.wbb

## 2016-11-06 NOTE — Progress Notes (Signed)
Nursing Note: Received report from [place] in the ED.wbb

## 2016-11-06 NOTE — Progress Notes (Signed)
Nursing Note> Pt up at least 5-6 times,drenched sweating,required,linen and gown change.After sweats,pt is freezing for a short while because he is soaking wet.wbb

## 2016-11-06 NOTE — Progress Notes (Signed)
Nursing Note: Pt up to void.Pt "feels full"the patient was bladder scanned for >200 cc.After voiding pt says he still fells full.Pt required in and out cath in the ED.A: paged on-call.wbb

## 2016-11-06 NOTE — Consult Note (Addendum)
Urology Consult  Consulting MD: Tat  CC: Lef thydronephrosis  HPI: This is a 65 year old male who was admitted to the hospital yesterday with significant left flank pain.  He has a history significant for prostate cancer, diagnosed first in September 2016 with his PSA of 120, or thereabouts.  The patient was initiated on androgen deprivation therapy, apparently a 4 month Lupron injection, at that time.  Curative therapy was not attempted, as he did have significant metastatic adenopathy at that time.  The patient apparently moved to [place] where he tried cannabis therapy for a while.  Apparently, he came back to [facility] in October this year with his PSA in the 17s with progressive disease.  He was restarted on Lupron, a 6 month injection, at that point.  The patient is typically followed at the [facility] in [place], [place].  He does not have a local oncologist or urologist.  Over the past 2-3 days.  He is experienced significant left flank pain.  He also has significant hyperhidrosis/hot flashes with the Lupron injections.  He has tried Pro-Banthine which may well have precipitated his hospitalization.  At the present time.  He has had no fever or chills.  He does have significant crampy/colicky left flank pain with nausea.  No right-sided abdominal discomfort.  The patient had a renal ultrasound recently revealing left hydronephrosis.  CT scan revealed left hydronephrosis for the proximal third of the left ureter, with transition point at that location.  There is retroperitoneal adenopathy.  Urologic consultation is requested.  PMH: Past Medical History:  Diagnosis Date  . Cancer [facility])    Prostate  . Prostate cancer, primary, with metastasis from prostate to other site [facility])     PSH: History reviewed. No pertinent surgical history.  Allergies: No Known Allergies  Medications: Prescriptions Prior to Admission  Medication Sig Dispense Refill Last Dose  .  cyclobenzaprine (FLEXERIL) 10 MG tablet Take 10 mg by mouth every 8 (eight) hours as needed for muscle spasms.   11/05/2016 at Unknown time  . gabapentin (NEURONTIN) 300 MG capsule Take 300 mg by mouth 3 (three) times daily as needed (pain).   11/05/2016 at Unknown time  . meloxicam (MOBIC) 15 MG tablet Take 15 mg by mouth daily.   11/05/2016 at Unknown time  . propantheline (PROBANTHINE) 15 MG tablet Take 15 mg by mouth 3 (three) times daily with meals.   11/04/2016 at Unknown time  . tamsulosin (FLOMAX) 0.4 MG CAPS capsule Take 0.4 mg by mouth at bedtime.   11/04/2016 at Unknown time  . traMADol (ULTRAM) 50 MG tablet Take 50 mg by mouth every 6 (six) hours as needed for moderate pain.   11/05/2016 at Unknown time  . FLUoxetine (PROZAC) 20 MG tablet Take 1 tablet (20 mg total) by mouth daily. (Patient not taking: Reported on 11/05/2016) 30 tablet 3 Not Taking at Unknown time     Social History: Social History   Social History  . Marital status: Married    Spouse name: N/A  . Number of children: N/A  . Years of education: N/A   Occupational History  . Not on file.   Social History Main Topics  . Smoking status: Former Research scientist (life sciences)  . Smokeless tobacco: Never Used  . Alcohol use No  . Drug use:     Types: Marijuana     Comment: daily  . Sexual activity: Not on file   Other Topics Concern  . Not on file  Social History Narrative  . No narrative on file    Family History: Family History  Problem Relation Age of Onset  . Hypertension Brother     Review of Systems: Positive: Hot flashes, left flank pain, hyperhidrosi Negative:   A further 10 point review of systems was negative except what is listed in the HPI.  Physical Exam: @VITALS2 @ General: No acute distress.  Awake. Head:  Normocephalic.  Atraumatic. ENT:  EOMI.  Mucous membranes moist Neck:  Supple.  No lymphadenopathy. CV:  S1 present. S2 present. Regular rate. Pulmonary: Equal effort bilaterally.  Clear to  auscultation bilaterally. Abdomen: Soft.  Left lower quadrant and left CVA tenderness.  No rebound or guarding. Skin:  Normal turgor.  No visible rash. Extremity: No gross deformity of bilateral upper extremities.  No gross deformity of    bilateral lower extremities. Neurologic: Alert. Appropriate mood.    Studies:  Recent Labs     11/05/16  1606  HGB  10.7*  WBC  5.2  PLT  128*    Recent Labs     11/05/16  1606  11/06/16  0417  NA  137  136  K  4.1  5.3*  CL  106  106  CO2  23  25  BUN  29*  27*  CREATININE  2.05*  2.05*  CALCIUM  9.2  9.5  GFRNONAA  32*  32*  GFRAA  37*  37*     No results for input(s): INR, APTT in the last 72 hours.  Invalid input(s): PT   Invalid input(s): ABG    I reviewed the patient's CT images, renal ultrasound images.  These were verbally shared with the patient.  Laboratories were also reviewed.  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  The patient is currently on androgen deprivation therapy through the [facility].  Significant hyperhidrosis/hot flashes  Plan: 1.  I discussed management of hydronephrosis with the patient.  I would suggest at this point urgent stenting of the left ureter.  This can be scheduled tomorrow as an inpatient.  I'll make the patient nothing by mouth  2.  Regarding the patient's hot flashes/symptoms from androgen deprivation therapy, I have started him on megestrol  acetate 20 milligrams twice daily.  Hopefully, this will help the patient's symptoms.  3.  Once the stent is in place, he can easily follow-up as needed at the during [facility] oncology/urology department    [phone]

## 2016-11-06 NOTE — Progress Notes (Signed)
Nursing Note: Checked BP manually prior to medicating for BP.BP was 150/82 and P-80.wbb

## 2016-11-07 ENCOUNTER — Encounter (HOSPITAL_COMMUNITY): Payer: Self-pay | Admitting: *Deleted

## 2016-11-07 ENCOUNTER — Inpatient Hospital Stay (HOSPITAL_COMMUNITY): Payer: Medicare Other | Admitting: Certified Registered"

## 2016-11-07 ENCOUNTER — Encounter (HOSPITAL_COMMUNITY)
Admission: EM | Disposition: A | Discharge status: Home / Self Care | Payer: Self-pay | Source: Home / Self Care | Attending: Internal Medicine

## 2016-11-07 DIAGNOSIS — N1330 Unspecified hydronephrosis: Secondary | ICD-10-CM

## 2016-11-07 HISTORY — PX: CYSTOSCOPY W/ URETERAL STENT PLACEMENT: SHX1429

## 2016-11-07 LAB — BASIC METABOLIC PANEL
Anion gap: 8 (ref 5–15)
BUN: 23 mg/dL — AB (ref 6–20)
CHLORIDE: 106 mmol/L (ref 101–111)
CO2: 23 mmol/L (ref 22–32)
CREATININE: 1.96 mg/dL — AB (ref 0.61–1.24)
Calcium: 9.6 mg/dL (ref 8.9–10.3)
GFR calc Af Amer: 40 mL/min — ABNORMAL LOW (ref >60)
GFR calc non Af Amer: 34 mL/min — ABNORMAL LOW (ref >60)
Glucose, Bld: 113 mg/dL — ABNORMAL HIGH (ref 65–99)
Potassium: 4.5 mmol/L (ref 3.5–5.1)
SODIUM: 137 mmol/L (ref 135–145)

## 2016-11-07 LAB — URINE CULTURE: CULTURE: NO GROWTH

## 2016-11-07 LAB — UREA NITROGEN, URINE: Urea Nitrogen, Ur: 423 mg/dL

## 2016-11-07 SURGERY — CYSTOSCOPY, WITH RETROGRADE PYELOGRAM AND URETERAL STENT INSERTION
Anesthesia: General | Laterality: Left

## 2016-11-07 MED ORDER — FERROUS SULFATE 325 (65 FE) MG PO TABS: 325.0000 mg | ORAL_TABLET | Freq: Every day | ORAL | 3 refills | Status: AC

## 2016-11-07 MED ORDER — SODIUM CHLORIDE 0.9 % IR SOLN
Status: DC | PRN
  Administered 2016-11-07: 4000 mL via INTRAVESICAL

## 2016-11-07 MED ORDER — PROMETHAZINE HCL 25 MG/ML IJ SOLN: 6.2500 mg | INTRAMUSCULAR | Status: DC | PRN

## 2016-11-07 MED ORDER — ONDANSETRON HCL 4 MG/2ML IJ SOLN
INTRAMUSCULAR | Status: DC | PRN
Start: 2016-11-07 — End: 2016-11-07
  Administered 2016-11-07: 4 mg via INTRAVENOUS

## 2016-11-07 MED ORDER — HYDROCODONE-ACETAMINOPHEN 5-325 MG PO TABS
1.0000 | ORAL_TABLET | Freq: Four times a day (QID) | ORAL | 0 refills | Status: AC | PRN
Start: 2016-11-07 — End: ?

## 2016-11-07 MED ORDER — FENTANYL CITRATE (PF) 100 MCG/2ML IJ SOLN
INTRAMUSCULAR | Status: DC | PRN
  Administered 2016-11-07 (×4): 50 ug via INTRAVENOUS

## 2016-11-07 MED ORDER — LIDOCAINE HCL 2 % EX GEL
CUTANEOUS | Status: AC
  Filled 2016-11-07: qty 5

## 2016-11-07 MED ORDER — PHENYLEPHRINE 40 MCG/ML (10ML) SYRINGE FOR IV PUSH (FOR BLOOD PRESSURE SUPPORT)
PREFILLED_SYRINGE | INTRAVENOUS | Status: AC
  Filled 2016-11-07: qty 10

## 2016-11-07 MED ORDER — KETOROLAC TROMETHAMINE 30 MG/ML IJ SOLN: 30.0000 mg | Freq: Once | INTRAMUSCULAR | Status: DC | PRN

## 2016-11-07 MED ORDER — PHENAZOPYRIDINE HCL 200 MG PO TABS: 200.0000 mg | ORAL_TABLET | Freq: Three times a day (TID) | ORAL | 0 refills | Status: AC | PRN

## 2016-11-07 MED ORDER — MEGESTROL ACETATE 20 MG PO TABS: 20.0000 mg | ORAL_TABLET | Freq: Two times a day (BID) | ORAL | 0 refills | Status: AC

## 2016-11-07 MED ORDER — FENTANYL CITRATE (PF) 100 MCG/2ML IJ SOLN
INTRAMUSCULAR | Status: AC
  Filled 2016-11-07: qty 2

## 2016-11-07 MED ORDER — LIDOCAINE HCL 2 % EX GEL
CUTANEOUS | Status: DC | PRN
  Administered 2016-11-07: 1 via URETHRAL

## 2016-11-07 MED ORDER — FENTANYL CITRATE (PF) 100 MCG/2ML IJ SOLN: 25.0000 ug | INTRAMUSCULAR | Status: DC | PRN

## 2016-11-07 MED ORDER — PROPOFOL 10 MG/ML IV BOLUS
INTRAVENOUS | Status: AC
  Filled 2016-11-07: qty 20

## 2016-11-07 MED ORDER — CEFAZOLIN SODIUM-DEXTROSE 2-4 GM/100ML-% IV SOLN
INTRAVENOUS | Status: AC
  Filled 2016-11-07: qty 100

## 2016-11-07 MED ORDER — MIDAZOLAM HCL 2 MG/2ML IJ SOLN
INTRAMUSCULAR | Status: DC | PRN
  Administered 2016-11-07: 2 mg via INTRAVENOUS

## 2016-11-07 MED ORDER — FERROUS SULFATE 325 (65 FE) MG PO TABS: 325.0000 mg | ORAL_TABLET | Freq: Every day | ORAL | Status: DC

## 2016-11-07 MED ORDER — DEXAMETHASONE SODIUM PHOSPHATE 10 MG/ML IJ SOLN
INTRAMUSCULAR | Status: DC | PRN
  Administered 2016-11-07: 10 mg via INTRAVENOUS

## 2016-11-07 MED ORDER — ONDANSETRON HCL 4 MG/2ML IJ SOLN
INTRAMUSCULAR | Status: AC
  Filled 2016-11-07: qty 2

## 2016-11-07 MED ORDER — DEXAMETHASONE SODIUM PHOSPHATE 10 MG/ML IJ SOLN
INTRAMUSCULAR | Status: AC
  Filled 2016-11-07: qty 1

## 2016-11-07 MED ORDER — PROPOFOL 10 MG/ML IV BOLUS
INTRAVENOUS | Status: DC | PRN
Start: 2016-11-07 — End: 2016-11-07
  Administered 2016-11-07: 200 mg via INTRAVENOUS

## 2016-11-07 MED ORDER — IOHEXOL 300 MG/ML  SOLN
INTRAMUSCULAR | Status: DC | PRN
  Administered 2016-11-07: 3 mL

## 2016-11-07 MED ORDER — LIDOCAINE 2% (20 MG/ML) 5 ML SYRINGE
INTRAMUSCULAR | Status: AC
  Filled 2016-11-07: qty 5

## 2016-11-07 MED ORDER — LIDOCAINE HCL (CARDIAC) 20 MG/ML IV SOLN
INTRAVENOUS | Status: DC | PRN
  Administered 2016-11-07: 60 mg via INTRATRACHEAL

## 2016-11-07 MED ORDER — PHENAZOPYRIDINE HCL 200 MG PO TABS
200.0000 mg | ORAL_TABLET | Freq: Three times a day (TID) | ORAL | Status: DC | PRN
  Filled 2016-11-07: qty 1

## 2016-11-07 MED ORDER — MIDAZOLAM HCL 2 MG/2ML IJ SOLN
INTRAMUSCULAR | Status: AC
  Filled 2016-11-07: qty 2

## 2016-11-07 MED ORDER — LACTATED RINGERS IV SOLN
INTRAVENOUS | Status: DC
  Administered 2016-11-07: 10:00:00 via INTRAVENOUS

## 2016-11-07 SURGICAL SUPPLY — 19 items
BAG URO CATCHER STRL LF (MISCELLANEOUS) ×3 IMPLANT
BASKET DAKOTA 1.9FR 11X120 (BASKET) IMPLANT
BASKET ZERO TIP NITINOL 2.4FR (BASKET) IMPLANT
BSKT STON RTRVL ZERO TP 2.4FR (BASKET)
CATH URET 5FR 28IN OPEN ENDED (CATHETERS) ×3 IMPLANT
CLOTH BEACON ORANGE TIMEOUT ST (SAFETY) ×3 IMPLANT
GLOVE BIOGEL M STRL SZ7.5 (GLOVE) ×3 IMPLANT
GOWN STRL REUS W/TWL XL LVL3 (GOWN DISPOSABLE) ×3 IMPLANT
GUIDEWIRE ANG ZIPWIRE 038X150 (WIRE) IMPLANT
GUIDEWIRE STR DUAL SENSOR (WIRE) ×3 IMPLANT
MANIFOLD NEPTUNE II (INSTRUMENTS) ×3 IMPLANT
PACK CYSTO (CUSTOM PROCEDURE TRAY) ×3 IMPLANT
SHEATH ACCESS URETERAL 24CM (SHEATH) IMPLANT
SHEATH ACCESS URETERAL 38CM (SHEATH) IMPLANT
SHEATH ACCESS URETERAL 54CM (SHEATH) IMPLANT
STENT CONTOUR 6FRX28X.038 (STENTS) ×3 IMPLANT
TUBING CONNECTING 10 (TUBING) ×2 IMPLANT
TUBING CONNECTING 10' (TUBING) ×1
WIRE COONS/BENSON .038X145CM (WIRE) IMPLANT

## 2016-11-07 NOTE — Anesthesia Procedure Notes (Signed)
Procedure Name: LMA Insertion Date/Time: 11/07/2016 10:43 AM Performed by: Pilar Grammes Pre-anesthesia Checklist: Patient identified, Suction available, Emergency Drugs available, Patient being monitored and Timeout performed Patient Re-evaluated:Patient Re-evaluated prior to inductionOxygen Delivery Method: Circle system utilized Preoxygenation: Pre-oxygenation with 100% oxygen Intubation Type: IV induction Ventilation: Mask ventilation without difficulty LMA: LMA inserted LMA Size: 4.0 Number of attempts: 1

## 2016-11-07 NOTE — Interval H&P Note (Signed)
History and Physical Interval Note:  11/07/2016 10:29 AM  Kyle Randall  has presented today for surgery, with the diagnosis of hydronephrosis  The various methods of treatment have been discussed with the patient and family. After consideration of risks, benefits and other options for treatment, the patient has consented to  Procedure(s): CYSTOSCOPY WITH RETROGRADE PYELOGRAM/URETERAL STENT PLACEMENT (Left) as a surgical intervention .  The patient's history has been reviewed, patient examined, no change in status, stable for surgery.  I have reviewed the patient's chart and labs.  Questions were answered to the patient's satisfaction.     Louis Meckel W

## 2016-11-07 NOTE — Addendum Note (Signed)
Addendum  created 11/07/16 1300 by Pilar Grammes, CRNA   Anesthesia Intra Meds edited

## 2016-11-07 NOTE — Discharge Instructions (Signed)
DISCHARGE INSTRUCTIONS FOR KIDNEY STONE/URETERAL STENT   MEDICATIONS:  1.  Resume all your other meds from home - except do not take any extra narcotic pain meds that you may have at home.  2. Pyridium is to help with the burning/stinging when you urinate.   ACTIVITY:  1. No strenuous activity x 1week  2. No driving while on narcotic pain medications  3. Drink plenty of water  4. Continue to walk at home - you can still get blood clots when you are at home, so keep active, but don't over do it.  5. May return to work/school tomorrow or when you feel ready   BATHING:  1. You can shower and we recommend daily showers  2. You have a string coming from your urethra: The stent string is attached to your ureteral stent. Do not pull on this.   SIGNS/SYMPTOMS TO CALL:  Please call us if you have a fever greater than 101.5, uncontrolled nausea/vomiting, uncontrolled pain, dizziness, unable to urinate, bloody urine, chest pain, shortness of breath, leg swelling, leg pain, redness around wound, drainage from wound, or any other concerns or questions.   You can reach [place] at [phone].   FOLLOW-UP:  1. You need to call to make an appointment with Dr. Diona Fanti at [facility].

## 2016-11-07 NOTE — Progress Notes (Signed)
Pt discharged home with brother and sons in stable condition.  Discharge instructions and scripts given. Pt verbalized understanding. No immediate questions or concerns at this time.

## 2016-11-07 NOTE — Op Note (Signed)
Preoperative diagnosis:  1. Left malignant hydronephrosis   Postoperative diagnosis:  1. Same   Procedure: 1. Cystoscopy, left retrograde pyelogram with interpretation 2. Left double-J ureteral stent placement  Surgeon: Ardis Hughs, MD  Anesthesia: General  Complications: None  Intraoperative findings:  #1: Patient cystoscopic evaluation was unremarkable. He did have a high median bar but a otherwise obstructing prostate. The bladder was unremarkable with no significant abnormalities. The ureteral orifices were orthotopic. There was some J hooking of the left ureter. #2: Initially, I false passed  the open ended catheter because of the J-hook. I was able to get the true lumen using a ureteroscope. I then advanced a wire through the ureteroscope into the true lumen and exchange it for a 5 [place] open-ended ureteral catheter. I then instilled 10 mL of Omnipaque contrast into the catheter performing the retrograde pyelogram. This demonstrated a approximately 2 cm stricture at the L4 ureter with associated proximal hydronephrosis. There was tortuosity and a large curl in the patient's ureter which I did straighten out prior to placing the stent.  EBL: Minimal  Specimens: None  Indication: Kyle Randall is a 65 y.o. patient with malignant left hydronephrosis because of intraperitoneal lymphadenopathy secondary to metastatic prostate cancer.  After reviewing the management options for treatment, he elected to proceed with the above surgical procedure(s). We have discussed the potential benefits and risks of the procedure, side effects of the proposed treatment, the likelihood of the patient achieving the goals of the procedure, and any potential problems that might occur during the procedure or recuperation. Informed consent has been obtained.  Description of procedure:  The patient was taken to the operating room and general anesthesia was induced.  The patient was placed in the  dorsal lithotomy position, prepped and draped in the usual sterile fashion, and preoperative antibiotics were administered. A preoperative time-out was performed.   A 21 French 30 cystoscope was gently passed through the patient's urethra and into the bladder. Cystoscopic evaluation was performed with the above findings. I then inserted a 5 [place] open-ended ureteral catheter into the left ureteral orifice with some difficulty as mentioned in the above findings. Retrograde pyelogram was performed with the above findings. A 0.038 sensor wire was then advanced through the open-ended catheter and into the left ureter where the ureter was straightened out. A 28 cm times 6 French double-J stent was then advanced over the wire and into the left renal pelvis under fluoroscopic guidance. Once the stent was noted to be in the renal pelvis the wire was gently backed out while simultaneously advancing the stent to the bladder neck. Once it was at the bladder neck this wire was completely removed and he denies curl in the bladder. Fluoroscopy confirmed excellent position of the stent. The stent tether was removed. The bladder was subsequently emptied. The urethra was then injected with lidocaine jelly. The patient was subsequently extubated and returned to the PACU in stable condition.  Ardis Hughs, M.D.

## 2016-11-07 NOTE — Discharge Summary (Signed)
Physician Discharge Summary  Kyle Randall RUE:[id number] DOB: October 05, 1951 DOA: 11/05/2016  PCP: Leandrew Koyanagi, MD (Inactive)  Admit date: 11/05/2016 Discharge date: 11/07/2016  Admitted From: Home Disposition:  Home  Recommendations for Outpatient Follow-up:  1. Follow up with PCP in 1-2 weeks 2. 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 Patient reports being under the care of [facility] and is managed with a hormonal antagonist for his prostate cancer. He reports that this treatment has consistently caused sweating and nausea, but reports that today has been much worse. Patient reports feeling nauseous and with worsening back pain yesterday, and then today experienced significant worsening with nonbloody nonbilious vomiting and profuse sweating. He denies any fevers, cough, chest pain, or palpitations. He denies diarrhea, melena, or hematochezia, but endorses constipation. He was seen for the worsening low back pain by his physician at the [facility] 2 months ago and reports that imaging of the lumbar spine at that time was negative for new metastasis.  The patient was admitted and started on intravenous fluids and intravenous hydromorphone for pain control. He received 4 L in the emergency department. Plain films of the lumbar spine were negative for metastasis or compression fracture.  MRI of the lumbar spine was negative for any spinal canal  stenosis or metastasis, but did show severe foraminal narrowing L5-S1. Renal ultrasound showed left-sided hydronephrosis. Urology was consulted. CT of the abdomen and  pelvis confirmed moderate left hydronephrosis. The patient underwent cystoscopy with placement of double-J stent.   Discharge Diagnoses:  Dehydration  - Pt reports frequent nausea and diaphoresis with his hormonal prostate CA tx, but has been worse last couple days with vomiting the day of admission  - He was given 4 L of NS in the ED - Continue prn antiemetics, monitor and correct electrolytes prn  - continue IVF-->improved  Kidney disease of uncertain chronicity  - No prior labs available at time of admission; -serum creatinine 2.05 at time of admission;  Serum creatinine 1.96 at time of d/c - in part due to obstructive uropathy from L- hydronephrosis - no improvement with IVF resuscitation - 12/19 renal US--L-hydronephrosis-->consult placed to urology - d/c meloxicam - FeNa <1% -Discontinue meloxicam in setting of renal failure  Left Hydronephrosis -12/20 CT abd--moderate left hydroureteronephrosis involving the proximal third of the left ureter. No visible obstructing stone or obstructing lesion. Extensive intraperitoneal lymphadenopathy concerning for metastatic prostate disease. Right renal cyst. 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 Diona Fanti in 2-3 weeks -Megace as discussed above  Acute on chronic low back pain/Uncontrolled pain - Pt reports longstanding LBP, worsening over the past 2 mos or more, and started on Mobic and tramadol in October  - Mobic and tramadol provided adequate  relief  initially, but not for past couple weeks; - Radiographs of the lumber spine obtained in ED feature DDD at L4-5 and L5-S1, but no fracture or met - There is no LE weakness, saddle anesthesia, incontinence, or urinary retention  - MRI L-spine--no canal stenosis. Severe L4-S1 foraminal narrowing. No metastasis. -will need outpt neurosurgical eval -Home with norco #20, 1 po q 6 hours prn pain  Normocytic anemia, thrombocytopenia  - Hgb is 10.7 and platelets 128,000 on admission with no priors for comparison  - Pt denies melena, hematochezia, hematuria, easy bruising or bleeding, or rash  - Uncertain etiology and uncertain chronicity - Iron sat 11%, ferritin 290 -B12--624 -folate 31.2 -ferrous sulfate 325 mg daily   Discharge Instructions  Discharge Instructions    Diet general    Complete by:  As directed    Increase activity slowly    Complete by:  As directed      Allergies as of 11/07/2016   No Known Allergies     Medication List    STOP taking these medications   FLUoxetine 20 MG tablet Commonly known as:  PROZAC   meloxicam 15 MG tablet Commonly known as:  MOBIC     TAKE these medications   cyclobenzaprine 10 MG tablet Commonly known as:  FLEXERIL Take 10 mg by mouth every 8 (eight) hours as needed for muscle spasms.   ferrous sulfate 325 (65 FE) MG tablet Take 1 tablet (325 mg total) by mouth daily with breakfast. Start taking on:  11/08/2016   gabapentin 300 MG capsule Commonly known as:  NEURONTIN Take 300 mg by mouth 3 (three) times daily as needed (pain).   HYDROcodone-acetaminophen 5-325 MG tablet Commonly known as:  NORCO/VICODIN Take 1-2 tablets by mouth every 6 (six) hours as needed for moderate pain.   megestrol 20 MG tablet Commonly known as:  MEGACE Take 1 tablet (20 mg total) by mouth 2 (two) times daily.   phenazopyridine 200 MG tablet Commonly known as:  PYRIDIUM Take 1 tablet (200 mg total) by mouth 3 (three) times daily as needed for  pain.   propantheline 15 MG tablet Commonly known as:  PROBANTHINE Take 15 mg by mouth 3 (three) times daily with meals.   tamsulosin 0.4 MG Caps capsule Commonly known as:  FLOMAX Take 0.4 mg by mouth at bedtime.   traMADol 50 MG tablet Commonly known as:  ULTRAM Take 50 mg by mouth every 6 (six) hours as needed for moderate pain.      Follow-up Information    DAHLSTEDT, Lillette Boxer, MD. Call in 2 weeks.   Specialty:  Urology Contact information: [address] [place] [address] [phone]          No Known Allergies  Consultations:  urology   Procedures/Studies: Ct Abdomen Pelvis Wo Contrast  Result Date: 11/06/2016 CLINICAL DATA:  65 year old male with history of left-sided flank pain for 1 month. Left-sided hydronephrosis. History prostate cancer diagnosed in September 2016 with nodal metastatic disease undergoing ongoing hormone therapy. EXAM: CT ABDOMEN AND PELVIS WITHOUT CONTRAST TECHNIQUE: Multidetector CT imaging of the abdomen and pelvis was performed following the standard protocol without IV contrast. COMPARISON:  No priors. FINDINGS: Lower chest: Unremarkable. Hepatobiliary: Diffuse low attenuation throughout the hepatic parenchyma, compatible with hepatic steatosis. Subcentimeter low-attenuation lesion in the right lobe of the liver is incompletely characterized on today's noncontrast CT examination, but is statistically likely a tiny cyst. No other definite hepatic lesions are identified on today's noncontrast CT  examination. Unenhanced appearance of the gallbladder is normal. Pancreas: No definite pancreatic mass or peripancreatic inflammatory changes are noted on today's noncontrast CT examination. Spleen: Unremarkable. Adrenals/Urinary Tract: There are no calcifications identified within the collecting system of either kidney, along the course of either ureter, or within the lumen of the urinary bladder. There is, however, moderate left-sided  hydroureteronephrosis involving the proximal third of the left ureter and left renal collecting system. The distal 2/3 of the left ureter appeared normal in caliber, and no obstructing lesion is identified on today's noncontrast CT examination. In the upper pole of the right kidney there is a 4.9 cm low-attenuation lesion, which is incompletely characterized, but statistically likely a cyst. Unenhanced appearance of the left kidney is unremarkable (other than the hydronephrosis mentioned below). Unenhanced appearance of the urinary bladder is normal. Bilateral adrenal glands are normal in appearance. Stomach/Bowel: The unenhanced appearance of the stomach is normal. There is no pathologic dilatation of small bowel or colon. Normal appendix. Vascular/Lymphatic: Aortic atherosclerosis, without definite aneurysm in the abdominal or pelvic vasculature. There is extensive retroperitoneal lymphadenopathy with multiple borderline enlarged and mildly enlarged lymph nodes measuring up to 12 mm in short axis in the high left para-aortic nodal station (image 33 of series 2) and 13 mm in short axis in the celiac axis nodal station (image 24 of series 2). Reproductive: Prostate gland and seminal vesicles are unremarkable in appearance. Other: No significant volume of ascites.  No pneumoperitoneum. Musculoskeletal: Bilateral pars defects at L5 with 4 mm of anterolisthesis of L5 upon S1. There are no aggressive appearing lytic or blastic lesions noted in the visualized portions of the skeleton. IMPRESSION: 1. Moderate left-sided hydroureteronephrosis involving the proximal third of the left ureter. No obstructing stone or other obstructing lesion identified. The possibility of a urothelial lesion in the ureter has should be considered. Alternatively, this could reflect the presence of a ureteral stricture. 2. Extensive retroperitoneal lymphadenopathy, as above, concerning for metastatic disease in this patient with history of  prostate cancer. 3. 4.9 cm low-attenuation lesion in the upper pole of the right kidney is incompletely characterized on today's noncontrast CT examination, but is statistically likely a cyst. 4. Hepatic steatosis. 5. Aortic atherosclerosis. Electronically Signed   By: Vinnie Langton M.D.   On: 11/06/2016 13:25   Dg Lumbar Spine Complete  Result Date: 11/05/2016 CLINICAL DATA:  Nausea vomiting and back pain. History of prostate malignancy treated with hormonal therapy onset of low back pain today without known injury. EXAM: LUMBAR SPINE - COMPLETE 4+ VIEW COMPARISON:  None in PACs FINDINGS: The lumbar vertebral bodies are preserved in height. There is mild grade 1 anterolisthesis of L4 with respect L5. No pars defects are visible. There is moderate degenerative disc space narrowing at L4-5. There is mild disc space narrowing at L5-S1. There is facet joint hypertrophy at L4-5 and L5-S1. The pedicles and transverse processes are intact. The observed portions of the sacrum are normal. 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 Facet joint hypertrophy at L4-5 and L5-S1 as well. There is no compression fracture nor lytic or blastic bony lesion. Electronically Signed   By: Mliss Wedin  [place] M.D.   On: 11/05/2016 16:45   Mr Lumbar Spine Wo Contrast  Result Date: 11/06/2016 CLINICAL DATA:  LEFT flank pain. History of metastatic prostate cancer. EXAM: MRI LUMBAR SPINE WITHOUT CONTRAST TECHNIQUE: Multiplanar, multisequence MR imaging of the lumbar spine was performed. No intravenous contrast  was administered. COMPARISON:  CT abdomen and pelvis November 06, 2016 at 1257 hours FINDINGS: SEGMENTATION: For the purposes of this report, the last well-formed intervertebral disc will be described as L5-S1. ALIGNMENT: Maintenance of the lumbar lordosis. 6 mm grade 1 L5-S1 anterolisthesis. VERTEBRAE:Vertebral bodies are intact. Bilateral L5 pars interarticularis  defects without bone marrow signal. Moderate L4-5, moderate to severe L5-S1 disc height loss with linear central low signal compatible with disc desiccation. Mild chronic lower lumbar discogenic endplate changes. No suspicious bone marrow signal. CONUS MEDULLARIS: Conus medullaris terminates at L1-2 and demonstrates normal morphology and signal characteristics. Cauda equina is normal. PARASPINAL AND SOFT TISSUES: Included prevertebral and paraspinal soft tissues are nonacute. Retroperitoneal lymphadenopathy and moderate LEFT hydroureteronephrosis as seen on today's CT abdomen and pelvis. DISC LEVELS (moderately motion degraded axial sequences): L1-2: No disc bulge, canal stenosis nor neural foraminal narrowing. L2-3: Small broad-based disc bulge asymmetric to the LEFT. No canal stenosis. Mild LEFT neural foraminal narrowing. L3-4: Small broad-based disc bulge. Mild facet arthropathy and ligamentum flavum redundancy without canal stenosis. Mild bilateral neural foraminal narrowing. L4-5: Small broad-based disc bulge. Moderate RIGHT greater than LEFT facet arthropathy with trace RIGHT facet effusion which is likely reactive. Ligamentum flavum redundancy. No canal stenosis. Moderate to severe RIGHT, mild LEFT neural foraminal narrowing. 5 mm bilateral facet synovial cyst extending into the paraspinal soft tissues. L5-S1: Anterolisthesis. Mild to moderate facet arthropathy and ligamentum flavum redundancy, as 4 mm LEFT facet synovial cyst extending posterior to the paraspinal soft tissues. No canal stenosis. Moderate RIGHT, severe LEFT neural foraminal narrowing. IMPRESSION: No suspicious bone marrow signal to suggest acute fracture or metastasis. Grade 1 L5-S1 anterolisthesis on the basis of chronic bilateral L5 pars interarticularis defects. No canal stenosis. Neural foraminal narrowing L2-3 through L5-S1: Severe on the LEFT at L5-S1. Retroperitoneal lymphadenopathy and LEFT hydronephrosis, please see today's CT of  the abdomen and pelvis for dedicated description. Electronically Signed   By: Elon Alas M.D.   On: 11/06/2016 20:11   US Renal  Result Date: 11/05/2016 CLINICAL DATA:  Left flank pain for 1 month EXAM: RENAL / URINARY TRACT ULTRASOUND COMPLETE COMPARISON:  None. FINDINGS: Right Kidney: Length: 12.5 cm. Cortical echogenicity within normal limits. Large cyst in the upper pole measuring 6 x 4.2 x 4 cm. Left Kidney: Length: 12.4 cm. Echogenicity within normal limits. Mild hydronephrosis and proximal hydroureter. Bladder: Appears normal for degree of bladder distention. Bilateral ureteral jet visualized. IMPRESSION: 1. Mild hydronephrosis and hydroureter of the left kidney. If 8 ureteral stone is suspected, CT KUB may be obtained. 2. Large 6 cm cyst in the upper pole of the right kidney. Electronically Signed   By: Donavan Foil M.D.   On: 11/05/2016 19:55        Discharge Exam: Vitals:   11/07/16 1226 11/07/16 1352  BP: (!) 152/103 (!) 145/99  Pulse: 88 79  Resp: 14 (!) 80  Temp: 98.7 F (37.1 C) 97.5 F (36.4 C)   Vitals:   11/07/16 1145 11/07/16 1200 11/07/16 1226 11/07/16 1352  BP: (!) 150/101 (!) 150/102 (!) 152/103 (!) 145/99  Pulse: 84 81 88 79  Resp: _0 (!) 80  Temp:  98.2 F (36.8 C) 98.7 F (37.1 C) 97.5 F (36.4 C)  TempSrc:    Oral  SpO2: 100% 100% 99% 100%  Weight:      Height:        General: Pt is alert, awake, not in acute distress Cardiovascular: RRR, S1/S2 +, no  rubs, no gallops Respiratory: CTA bilaterally, no wheezing, no rhonchi Abdominal: Soft, NT, ND, bowel sounds + Extremities: no edema, no cyanosis   The results of significant diagnostics from this hospitalization (including imaging, microbiology, ancillary and laboratory) are listed below for reference.    Significant Diagnostic Studies: Ct Abdomen Pelvis Wo Contrast  Result Date: 11/06/2016 CLINICAL DATA:  65 year old male with history of left-sided flank pain for 1 month.  Left-sided hydronephrosis. History prostate cancer diagnosed in September 2016 with nodal metastatic disease undergoing ongoing hormone therapy. EXAM: CT ABDOMEN AND PELVIS WITHOUT CONTRAST TECHNIQUE: Multidetector CT imaging of the abdomen and pelvis was performed following the standard protocol without IV contrast. COMPARISON:  No priors. FINDINGS: Lower chest: Unremarkable. Hepatobiliary: Diffuse low attenuation throughout the hepatic parenchyma, compatible with hepatic steatosis. Subcentimeter low-attenuation lesion in the right lobe of the liver is incompletely characterized on today's noncontrast CT examination, but is statistically likely a tiny cyst. No other definite hepatic lesions are identified on today's noncontrast CT examination. Unenhanced appearance of the gallbladder is normal. Pancreas: No definite pancreatic mass or peripancreatic inflammatory changes are noted on today's noncontrast CT examination. Spleen: Unremarkable. Adrenals/Urinary Tract: There are no calcifications identified within the collecting system of either kidney, along the course of either ureter, or within the lumen of the urinary bladder. There is, however, moderate left-sided hydroureteronephrosis involving the proximal third of the left ureter and left renal collecting system. The distal 2/3 of the left ureter appeared normal in caliber, and no obstructing lesion is identified on today's noncontrast CT examination. In the upper pole of the right kidney there is a 4.9 cm low-attenuation lesion, which is incompletely characterized, but statistically likely a cyst. Unenhanced appearance of the left kidney is unremarkable (other than the hydronephrosis mentioned below). Unenhanced appearance of the urinary bladder is normal. Bilateral adrenal glands are normal in appearance. Stomach/Bowel: The unenhanced appearance of the stomach is normal. There is no pathologic dilatation of small bowel or colon. Normal appendix.  Vascular/Lymphatic: Aortic atherosclerosis, without definite aneurysm in the abdominal or pelvic vasculature. There is extensive retroperitoneal lymphadenopathy with multiple borderline enlarged and mildly enlarged lymph nodes measuring up to 12 mm in short axis in the high left para-aortic nodal station (image 33 of series 2) and 13 mm in short axis in the celiac axis nodal station (image 24 of series 2). Reproductive: Prostate gland and seminal vesicles are unremarkable in appearance. Other: No significant volume of ascites.  No pneumoperitoneum. Musculoskeletal: Bilateral pars defects at L5 with 4 mm of anterolisthesis of L5 upon S1. There are no aggressive appearing lytic or blastic lesions noted in the visualized portions of the skeleton. IMPRESSION: 1. Moderate left-sided hydroureteronephrosis involving the proximal third of the left ureter. No obstructing stone or other obstructing lesion identified. The possibility of a urothelial lesion in the ureter has should be considered. Alternatively, this could reflect the presence of a ureteral stricture. 2. Extensive retroperitoneal lymphadenopathy, as above, concerning for metastatic disease in this patient with history of prostate cancer. 3. 4.9 cm low-attenuation lesion in the upper pole of the right kidney is incompletely characterized on today's noncontrast CT examination, but is statistically likely a cyst. 4. Hepatic steatosis. 5. Aortic atherosclerosis. Electronically Signed   By: Vinnie Langton M.D.   On: 11/06/2016 13:25   Dg Lumbar Spine Complete  Result Date: 11/05/2016 CLINICAL DATA:  Nausea vomiting and back pain. History of prostate malignancy treated with hormonal therapy onset of low back pain today without known injury. EXAM:  LUMBAR SPINE - COMPLETE 4+ VIEW COMPARISON:  None in PACs FINDINGS: The lumbar vertebral bodies are preserved in height. There is mild grade 1 anterolisthesis of L4 with respect L5. No pars defects are visible. There  is moderate degenerative disc space narrowing at L4-5. There is mild disc space narrowing at L5-S1. There is facet joint hypertrophy at L4-5 and L5-S1. The pedicles and transverse processes are intact. The observed portions of the sacrum are normal. 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 Facet joint hypertrophy at L4-5 and L5-S1 as well. There is no compression fracture nor lytic or blastic bony lesion. Electronically Signed   By: Saurabh Hettich  [place] M.D.   On: 11/05/2016 16:45   Mr Lumbar Spine Wo Contrast  Result Date: 11/06/2016 CLINICAL DATA:  LEFT flank pain. History of metastatic prostate cancer. EXAM: MRI LUMBAR SPINE WITHOUT CONTRAST TECHNIQUE: Multiplanar, multisequence MR imaging of the lumbar spine was performed. No intravenous contrast was administered. COMPARISON:  CT abdomen and pelvis November 06, 2016 at 1257 hours FINDINGS: SEGMENTATION: For the purposes of this report, the last well-formed intervertebral disc will be described as L5-S1. ALIGNMENT: Maintenance of the lumbar lordosis. 6 mm grade 1 L5-S1 anterolisthesis. VERTEBRAE:Vertebral bodies are intact. Bilateral L5 pars interarticularis defects without bone marrow signal. Moderate L4-5, moderate to severe L5-S1 disc height loss with linear central low signal compatible with disc desiccation. Mild chronic lower lumbar discogenic endplate changes. No suspicious bone marrow signal. CONUS MEDULLARIS: Conus medullaris terminates at L1-2 and demonstrates normal morphology and signal characteristics. Cauda equina is normal. PARASPINAL AND SOFT TISSUES: Included prevertebral and paraspinal soft tissues are nonacute. Retroperitoneal lymphadenopathy and moderate LEFT hydroureteronephrosis as seen on today's CT abdomen and pelvis. DISC LEVELS (moderately motion degraded axial sequences): L1-2: No disc bulge, canal stenosis nor neural foraminal narrowing. L2-3: Small broad-based disc bulge  asymmetric to the LEFT. No canal stenosis. Mild LEFT neural foraminal narrowing. L3-4: Small broad-based disc bulge. Mild facet arthropathy and ligamentum flavum redundancy without canal stenosis. Mild bilateral neural foraminal narrowing. L4-5: Small broad-based disc bulge. Moderate RIGHT greater than LEFT facet arthropathy with trace RIGHT facet effusion which is likely reactive. Ligamentum flavum redundancy. No canal stenosis. Moderate to severe RIGHT, mild LEFT neural foraminal narrowing. 5 mm bilateral facet synovial cyst extending into the paraspinal soft tissues. L5-S1: Anterolisthesis. Mild to moderate facet arthropathy and ligamentum flavum redundancy, as 4 mm LEFT facet synovial cyst extending posterior to the paraspinal soft tissues. No canal stenosis. Moderate RIGHT, severe LEFT neural foraminal narrowing. IMPRESSION: No suspicious bone marrow signal to suggest acute fracture or metastasis. Grade 1 L5-S1 anterolisthesis on the basis of chronic bilateral L5 pars interarticularis defects. No canal stenosis. Neural foraminal narrowing L2-3 through L5-S1: Severe on the LEFT at L5-S1. Retroperitoneal lymphadenopathy and LEFT hydronephrosis, please see today's CT of the abdomen and pelvis for dedicated description. Electronically Signed   By: Elon Alas M.D.   On: 11/06/2016 20:11   US Renal  Result Date: 11/05/2016 CLINICAL DATA:  Left flank pain for 1 month EXAM: RENAL / URINARY TRACT ULTRASOUND COMPLETE COMPARISON:  None. FINDINGS: Right Kidney: Length: 12.5 cm. Cortical echogenicity within normal limits. Large cyst in the upper pole measuring 6 x 4.2 x 4 cm. Left Kidney: Length: 12.4 cm. Echogenicity within normal limits. Mild hydronephrosis and proximal hydroureter. Bladder: Appears normal for degree of bladder distention. Bilateral ureteral jet visualized. IMPRESSION: 1. Mild hydronephrosis and hydroureter of the left kidney. If  8 ureteral stone is suspected, CT KUB may be obtained. 2. Large  6 cm cyst in the upper pole of the right kidney. Electronically Signed   By: Donavan Foil M.D.   On: 11/05/2016 19:55     Microbiology: Recent Results (from the past 240 hour(s))  Urine culture     Status: None   Collection Time: 11/05/16  4:00 PM  Result Value Ref Range Status   Specimen Description URINE, CATHETERIZED  Final   Special Requests NONE  Final   Culture NO GROWTH Performed at [facility]   Final   Report Status 11/07/2016 FINAL  Final  Surgical PCR screen     Status: None   Collection Time: 11/06/16 10:00 PM  Result Value Ref Range Status   MRSA, PCR NEGATIVE NEGATIVE Final   Staphylococcus aureus NEGATIVE NEGATIVE Final    Comment:        The Xpert SA Assay (FDA approved for NASAL specimens in patients over 85 years of age), is one component of a comprehensive surveillance program.  Test performance has been validated by [facility] for patients greater than or equal to 70 year old. It is not intended to diagnose infection nor to guide or monitor treatment.      Labs: Basic Metabolic Panel:  Recent Labs Lab 11/05/16 1606 11/06/16 0417 11/07/16 1301  NA 137 136 137  K 4.1 5.3* 4.5  CL 106 106 106  CO2 _0 GLUCOSE 103* 104* 113*  BUN 29* 27* 23*  CREATININE 2.05* 2.05* 1.96*  CALCIUM 9.2 9.5 9.6   Liver Function Tests:  Recent Labs Lab 11/05/16 1606  AST 28  ALT 31  ALKPHOS 46  BILITOT 0.6  PROT 7.7  ALBUMIN 4.0    Recent Labs Lab 11/05/16 1606  LIPASE 26   No results for input(s): AMMONIA in the last 168 hours. CBC:  Recent Labs Lab 11/05/16 1606  WBC 5.2  NEUTROABS 3.0  HGB 10.7*  HCT 30.5*  MCV 84.0  PLT 128*   Cardiac Enzymes: No results for input(s): CKTOTAL, CKMB, CKMBINDEX, TROPONINI in the last 168 hours. BNP: Invalid input(s): POCBNP CBG: No results for input(s): GLUCAP in the last 168 hours.  Time coordinating discharge:  Greater than 30 minutes  Signed:  Alice Vitelli, DO [facility] Pager: [phone] 11/07/2016, 2:39 PM

## 2016-11-07 NOTE — H&P (View-Only) (Signed)
Urology Consult  Consulting MD: Tat  CC: Lef thydronephrosis  HPI: This is a 65 year old male who was admitted to the hospital yesterday with significant left flank pain.  He has a history significant for prostate cancer, diagnosed first in September 2016 with his PSA of 120, or thereabouts.  The patient was initiated on androgen deprivation therapy, apparently a 4 month Lupron injection, at that time.  Curative therapy was not attempted, as he did have significant metastatic adenopathy at that time.  The patient apparently moved to [place] where he tried cannabis therapy for a while.  Apparently, he came back to [facility] in October this year with his PSA in the 48s with progressive disease.  He was restarted on Lupron, a 6 month injection, at that point.  The patient is typically followed at the [facility] in [place], [place].  He does not have a local oncologist or urologist.  Over the past 2-3 days.  He is experienced significant left flank pain.  He also has significant hyperhidrosis/hot flashes with the Lupron injections.  He has tried Pro-Banthine which may well have precipitated his hospitalization.  At the present time.  He has had no fever or chills.  He does have significant crampy/colicky left flank pain with nausea.  No right-sided abdominal discomfort.  The patient had a renal ultrasound recently revealing left hydronephrosis.  CT scan revealed left hydronephrosis for the proximal third of the left ureter, with transition point at that location.  There is retroperitoneal adenopathy.  Urologic consultation is requested.  PMH: Past Medical History:  Diagnosis Date  . Cancer [facility])    Prostate  . 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 No pertinent surgical history.  Allergies: No Known Allergies  Medications: Prescriptions Prior to Admission  Medication Sig Dispense Refill Last Dose  .  cyclobenzaprine (FLEXERIL) 10 MG tablet Take 10 mg by mouth every 8 (eight) hours as needed for muscle spasms.   11/05/2016 at Unknown time  . gabapentin (NEURONTIN) 300 MG capsule Take 300 mg by mouth 3 (three) times daily as needed (pain).   11/05/2016 at Unknown time  . meloxicam (MOBIC) 15 MG tablet Take 15 mg by mouth daily.   11/05/2016 at Unknown time  . propantheline (PROBANTHINE) 15 MG tablet Take 15 mg by mouth 3 (three) times daily with meals.   11/04/2016 at Unknown time  . tamsulosin (FLOMAX) 0.4 MG CAPS capsule Take 0.4 mg by mouth at bedtime.   11/04/2016 at Unknown time  . traMADol (ULTRAM) 50 MG tablet Take 50 mg by mouth every 6 (six) hours as needed for moderate pain.   11/05/2016 at Unknown time  . FLUoxetine (PROZAC) 20 MG tablet Take 1 tablet (20 mg total) by mouth daily. (Patient not taking: Reported on 11/05/2016) 30 tablet 3 Not Taking at Unknown time     Social History: Social History   Social History  . Marital status: Married    Spouse name: N/A  . Number of children: N/A  . Years of education: N/A   Occupational History  . Not on file.   Social History Main Topics  . Smoking status: Former Research scientist (life sciences)  . Smokeless tobacco: Never Used  . Alcohol use No  . Drug use:     Types: Marijuana     Comment: daily  . Sexual activity: Not on file   Other Topics Concern  . Not on file  Social History Narrative  . No narrative on file    Family History: Family History  Problem Relation Age of Onset  . Hypertension Brother     Review of Systems: Positive: Hot flashes, left flank pain, hyperhidrosi Negative:   A further 10 point review of systems was negative except what is listed in the HPI.  Physical Exam: @VITALS2 @ General: No acute distress.  Awake. Head:  Normocephalic.  Atraumatic. ENT:  EOMI.  Mucous membranes moist Neck:  Supple.  No lymphadenopathy. CV:  S1 present. S2 present. Regular rate. Pulmonary: Equal effort bilaterally.  Clear to  auscultation bilaterally. Abdomen: Soft.  Left lower quadrant and left CVA tenderness.  No rebound or guarding. Skin:  Normal turgor.  No visible rash. Extremity: No gross deformity of bilateral upper extremities.  No gross deformity of    bilateral lower extremities. Neurologic: Alert. Appropriate mood.    Studies:  Recent Labs     11/05/16  1606  HGB  10.7*  WBC  5.2  PLT  128*    Recent Labs     11/05/16  1606  11/06/16  0417  NA  137  136  K  4.1  5.3*  CL  106  106  CO2  23  25  BUN  29*  27*  CREATININE  2.05*  2.05*  CALCIUM  9.2  9.5  GFRNONAA  32*  32*  GFRAA  37*  37*     No results for input(s): INR, APTT in the last 72 hours.  Invalid input(s): PT   Invalid input(s): ABG    I reviewed the patient's CT images, renal ultrasound images.  These were verbally shared with the patient.  Laboratories were also reviewed.  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  The patient is currently on androgen deprivation therapy through the [facility].  Significant hyperhidrosis/hot flashes  Plan: 1.  I discussed management of hydronephrosis with the patient.  I would suggest at this point urgent stenting of the left ureter.  This can be scheduled tomorrow as an inpatient.  I'll make the patient nothing by mouth  2.  Regarding the patient's hot flashes/symptoms from androgen deprivation therapy, I have started him on megestrol  acetate 20 milligrams twice daily.  Hopefully, this will help the patient's symptoms.  3.  Once the stent is in place, he can easily follow-up as needed at the during [facility] oncology/urology department    Pager:[phone]

## 2016-11-07 NOTE — Transfer of Care (Signed)
Immediate Anesthesia Transfer of Care Note  Patient: Kyle Randall  Procedure(s) Performed: Procedure(s): CYSTOSCOPY WITH RETROGRADE PYELOGRAM/URETERSCOPY/URETERAL STENT PLACEMENT (Left)  Patient Location: PACU and PICU  Anesthesia Type:General  Level of Consciousness: alert , oriented and patient cooperative  Airway & Oxygen Therapy: Patient connected to face mask oxygen  Post-op Assessment: Report given to RN and Post -op Vital signs reviewed and stable  Post vital signs: stable  Last Vitals:  Vitals:   11/06/16 2143 11/07/16 0513  BP: (!) 144/73 (!) 145/73  Pulse: 88 84  Resp: 16 16  Temp: 37.2 C 37.2 C    Last Pain:  Vitals:   11/07/16 0941  TempSrc:   PainSc: 0-No pain      Patients Stated Pain Goal: 2 ([id number])  Complications: No apparent anesthesia complications

## 2016-11-07 NOTE — Progress Notes (Signed)
The patient is status post cystoscopy, left retrograde pyelogram. This demonstrated a 2 cm stricture around the level of L4 with associated proximal hydronephrosis and a tortuous dilated ureter. A 28 cm double-J ureteral stent was placed without significant difficulty.  At this point, we will schedule the patient to follow-up with Dr. Diona Fanti in the clinic in 2 or 3 weeks. The patient has been started on Megace 20 mg twice a day. This should be continued as an outpatient. I have also added Pyridium for any burning or discomfort. If the patient continues to have severe discomfort upon urination, Flomax may be added for some relief.  I will leave the remaining aspects of the patient's care to the hospital medicine service. Please contact urology with any additional questions or concerns.

## 2016-11-07 NOTE — Anesthesia Postprocedure Evaluation (Signed)
Anesthesia Post Note  Patient: Kyle Randall  Procedure(s) Performed: Procedure(s) (LRB): CYSTOSCOPY WITH RETROGRADE PYELOGRAM/URETERSCOPY/URETERAL STENT PLACEMENT (Left)  Patient location during evaluation: PACU Anesthesia Type: General Level of consciousness: awake and alert Pain management: pain level controlled Vital Signs Assessment: post-procedure vital signs reviewed and stable Respiratory status: spontaneous breathing, nonlabored ventilation, respiratory function stable and patient connected to nasal cannula oxygen Cardiovascular status: blood pressure returned to baseline and stable Postop Assessment: no signs of nausea or vomiting Anesthetic complications: no       Last Vitals:  Vitals:   11/07/16 1130 11/07/16 1145  BP: (!) 149/106 (!) 150/101  Pulse: 83 84  Resp: 11 11  Temp:      Last Pain:  Vitals:   11/07/16 1145  TempSrc:   PainSc: 0-No pain                 Loida Calamia S

## 2016-11-07 NOTE — Anesthesia Preprocedure Evaluation (Signed)
Anesthesia Evaluation  Patient identified by MRN, date of birth, ID band Patient awake    Reviewed: Allergy & Precautions, NPO status , Patient's Chart, lab work & pertinent test results  Airway Mallampati: II  TM Distance: >3 FB Neck ROM: Full    Dental no notable dental hx.    Pulmonary neg pulmonary ROS, former smoker,    Pulmonary exam normal breath sounds clear to auscultation       Cardiovascular negative cardio ROS Normal cardiovascular exam Rhythm:Regular Rate:Normal     Neuro/Psych negative neurological ROS  negative psych ROS   GI/Hepatic negative GI ROS, Neg liver ROS,   Endo/Other  negative endocrine ROS  Renal/GU Renal InsufficiencyRenal disease  negative genitourinary   Musculoskeletal negative musculoskeletal ROS (+)   Abdominal   Peds negative pediatric ROS (+)  Hematology  (+) anemia ,   Anesthesia Other Findings   Reproductive/Obstetrics negative OB ROS                             Anesthesia Physical Anesthesia Plan  ASA: II  Anesthesia Plan: General   Post-op Pain Management:    Induction: Intravenous  Airway Management Planned: LMA  Additional Equipment:   Intra-op Plan:   Post-operative Plan: Extubation in OR  Informed Consent: I have reviewed the patients History and Physical, chart, labs and discussed the procedure including the risks, benefits and alternatives for the proposed anesthesia with the patient or authorized representative who has indicated his/her understanding and acceptance.   Dental advisory given  Plan Discussed with: CRNA and Surgeon  Anesthesia Plan Comments:         Anesthesia Quick Evaluation

## 2017-11-14 ENCOUNTER — Emergency Department (HOSPITAL_COMMUNITY): Payer: Medicare Other

## 2017-11-14 ENCOUNTER — Encounter (HOSPITAL_COMMUNITY): Payer: Self-pay

## 2017-11-14 ENCOUNTER — Inpatient Hospital Stay (HOSPITAL_COMMUNITY)
Admission: EM | Admit: 2017-11-14 | Discharge: 2017-11-18 | DRG: 208 | Disposition: E | Payer: Medicare Other | Attending: Internal Medicine | Admitting: Internal Medicine

## 2017-11-14 DIAGNOSIS — I469 Cardiac arrest, cause unspecified: Secondary | ICD-10-CM | POA: Diagnosis present

## 2017-11-14 DIAGNOSIS — Z515 Encounter for palliative care: Secondary | ICD-10-CM | POA: Diagnosis present

## 2017-11-14 DIAGNOSIS — I959 Hypotension, unspecified: Secondary | ICD-10-CM

## 2017-11-14 DIAGNOSIS — C61 Malignant neoplasm of prostate: Secondary | ICD-10-CM | POA: Diagnosis present

## 2017-11-14 DIAGNOSIS — Z87891 Personal history of nicotine dependence: Secondary | ICD-10-CM | POA: Diagnosis not present

## 2017-11-14 DIAGNOSIS — I2699 Other pulmonary embolism without acute cor pulmonale: Principal | ICD-10-CM | POA: Diagnosis present

## 2017-11-14 DIAGNOSIS — C775 Secondary and unspecified malignant neoplasm of intrapelvic lymph nodes: Secondary | ICD-10-CM | POA: Diagnosis present

## 2017-11-14 DIAGNOSIS — Z66 Do not resuscitate: Secondary | ICD-10-CM | POA: Diagnosis present

## 2017-11-14 DIAGNOSIS — R092 Respiratory arrest: Secondary | ICD-10-CM

## 2017-11-14 DIAGNOSIS — I219 Acute myocardial infarction, unspecified: Secondary | ICD-10-CM | POA: Diagnosis present

## 2017-11-14 LAB — I-STAT CHEM 8, ED
BUN: 22 mg/dL — ABNORMAL HIGH (ref 6–20)
CALCIUM ION: 1.13 mmol/L — AB (ref 1.15–1.40)
CHLORIDE: 106 mmol/L (ref 101–111)
CREATININE: 1.7 mg/dL — AB (ref 0.61–1.24)
GLUCOSE: 340 mg/dL — AB (ref 65–99)
HCT: 30 % — ABNORMAL LOW (ref 39.0–52.0)
Hemoglobin: 10.2 g/dL — ABNORMAL LOW (ref 13.0–17.0)
POTASSIUM: 4.3 mmol/L (ref 3.5–5.1)
Sodium: 139 mmol/L (ref 135–145)
TCO2: 16 mmol/L — ABNORMAL LOW (ref 22–32)

## 2017-11-14 LAB — COMPREHENSIVE METABOLIC PANEL
ALK PHOS: 46 U/L (ref 38–126)
ALT: 190 U/L — AB (ref 17–63)
ANION GAP: 18 — AB (ref 5–15)
AST: 257 U/L — AB (ref 15–41)
Albumin: 2.3 g/dL — ABNORMAL LOW (ref 3.5–5.0)
BILIRUBIN TOTAL: 0.5 mg/dL (ref 0.3–1.2)
BUN: 18 mg/dL (ref 6–20)
CALCIUM: 8.7 mg/dL — AB (ref 8.9–10.3)
CO2: 12 mmol/L — AB (ref 22–32)
Chloride: 108 mmol/L (ref 101–111)
Creatinine, Ser: 1.91 mg/dL — ABNORMAL HIGH (ref 0.61–1.24)
GFR calc Af Amer: 40 mL/min — ABNORMAL LOW (ref >60)
GFR calc non Af Amer: 35 mL/min — ABNORMAL LOW (ref >60)
GLUCOSE: 348 mg/dL — AB (ref 65–99)
Potassium: 4.4 mmol/L (ref 3.5–5.1)
SODIUM: 138 mmol/L (ref 135–145)
TOTAL PROTEIN: 5.1 g/dL — AB (ref 6.5–8.1)

## 2017-11-14 LAB — I-STAT CG4 LACTIC ACID, ED: LACTIC ACID, VENOUS: 14.57 mmol/L — AB (ref 0.5–1.9)

## 2017-11-14 LAB — CBC WITH DIFFERENTIAL/PLATELET
Basophils Absolute: 0 10*3/uL (ref 0.0–0.1)
Basophils Relative: 0 %
EOS ABS: 0.1 10*3/uL (ref 0.0–0.7)
Eosinophils Relative: 1 %
HEMATOCRIT: 32.4 % — AB (ref 39.0–52.0)
Hemoglobin: 9.7 g/dL — ABNORMAL LOW (ref 13.0–17.0)
Lymphocytes Relative: 48 %
Lymphs Abs: 6.6 10*3/uL — ABNORMAL HIGH (ref 0.7–4.0)
MCH: 28 pg (ref 26.0–34.0)
MCHC: 29.9 g/dL — AB (ref 30.0–36.0)
MCV: 93.6 fL (ref 78.0–100.0)
MONOS PCT: 4 %
Monocytes Absolute: 0.6 10*3/uL (ref 0.1–1.0)
NEUTROS PCT: 47 %
Neutro Abs: 6.5 10*3/uL (ref 1.7–7.7)
Platelets: 155 10*3/uL (ref 150–400)
RBC: 3.46 MIL/uL — AB (ref 4.22–5.81)
RDW: 18.5 % — ABNORMAL HIGH (ref 11.5–15.5)
WBC: 13.8 10*3/uL — AB (ref 4.0–10.5)

## 2017-11-14 LAB — I-STAT TROPONIN, ED: TROPONIN I, POC: 2.51 ng/mL — AB (ref 0.00–0.08)

## 2017-11-14 MED ORDER — ATROPINE SULFATE 1 MG/ML IJ SOLN
INTRAMUSCULAR | Status: AC | PRN
  Administered 2017-11-14: 1 mg via INTRAVENOUS

## 2017-11-14 MED ORDER — ACETAMINOPHEN 650 MG RE SUPP: 650.0000 mg | Freq: Four times a day (QID) | RECTAL | Status: DC | PRN

## 2017-11-14 MED ORDER — FENTANYL CITRATE (PF) 100 MCG/2ML IJ SOLN
25.0000 ug | INTRAMUSCULAR | Status: DC | PRN
  Administered 2017-11-14 – 2017-11-15 (×4): 25 ug via INTRAVENOUS
  Filled 2017-11-14 (×4): qty 2

## 2017-11-14 MED ORDER — LORAZEPAM 2 MG/ML IJ SOLN
1.0000 mg | INTRAMUSCULAR | Status: DC | PRN
  Administered 2017-11-14 (×2): 1 mg via INTRAVENOUS
  Filled 2017-11-14 (×2): qty 1

## 2017-11-14 MED ORDER — EPINEPHRINE PF 1 MG/10ML IJ SOSY
PREFILLED_SYRINGE | INTRAMUSCULAR | Status: AC | PRN
  Administered 2017-11-14: 1 mg via INTRAVENOUS

## 2017-11-14 MED ORDER — LORAZEPAM 1 MG PO TABS: 1.0000 mg | ORAL_TABLET | ORAL | Status: DC | PRN

## 2017-11-14 MED ORDER — NOREPINEPHRINE BITARTRATE 1 MG/ML IV SOLN
50.0000 ug/min | INTRAVENOUS | Status: DC
  Administered 2017-11-14: 50 ug/min via INTRAVENOUS
  Filled 2017-11-14: qty 4

## 2017-11-14 MED ORDER — TENECTEPLASE 50 MG IV KIT
35.0000 mg | PACK | Freq: Once | INTRAVENOUS | Status: AC
  Administered 2017-11-14: 35 mg via INTRAVENOUS
  Filled 2017-11-14: qty 10

## 2017-11-14 MED ORDER — ACETAMINOPHEN 325 MG PO TABS: 650.0000 mg | ORAL_TABLET | Freq: Four times a day (QID) | ORAL | Status: DC | PRN

## 2017-11-14 MED ORDER — LORAZEPAM 2 MG/ML PO CONC: 1.0000 mg | ORAL | Status: DC | PRN

## 2017-11-14 MED ORDER — CALCIUM CHLORIDE 10 % IV SOLN
INTRAVENOUS | Status: AC | PRN
  Administered 2017-11-14: 1 g via INTRAVENOUS

## 2017-11-14 NOTE — ED Notes (Signed)
Spoke to Dr. Gilford Raid per family's request for pt to get a room upstairs

## 2017-11-14 NOTE — Progress Notes (Signed)
Responded to CPR to support patient and later family.  EDP spoke with family on several occasion to update them on patient status.  After consulting with EDP the family chose to withdraw all medical equipment and see how patient does. Patient being extubated. Patient long time girlfriend at bedside. Patient is also supported by Brother and sister -in Sports coach.  Kyle Randall will follow as needed.    10/26/2017 1330  Clinical Encounter Type  Visited With Patient;Family;Patient and family together;Health care provider  Visit Type Initial;Social support;ED;Patient actively dying  Referral From Nurse  Spiritual Encounters  Spiritual Needs Emotional;Grief support  Stress Factors  Family Stress Factors Exhausted;Major life changes  Kyle Randall, [facility], Pager [phone]

## 2017-11-14 NOTE — ED Provider Notes (Signed)
Pt signed out by Dr. Dolly Rias.  Pt was made comfort care only.  It was thought he would pass quickly, but he has not.  Family is requesting a room.  I spoke with Dr. Maudie Mercury ([facility]) for admission.   Isla Pence, MD 11/09/2017 [phone]

## 2017-11-14 NOTE — H&P (Signed)
TRH H&P   Patient Demographics:    Segundo Makela, is a 66 y.o. male  MRN: [id number]   DOB - February 10, 1951  Admit Date - 10/19/2017  Outpatient Primary MD for the patient is Leandrew Koyanagi, MD (Inactive)  Referring MD/NP/PA:  Isla Pence  Outpatient Specialists:   Patient coming from: home  Chief Complaint  Patient presents with  . Cardiac Arrest      HPI:    Osinachi Navarrette  is a 66 y.o. male, w metastatic prostate cancer who apparently was found down by friend around 11am.  Pt was in cardiac arrest and received 8 epi en route and lost pulse upon arrival to ED.  CPR initiated in ED and pt coded for >60 minutes.  Pt received TPA for ? PE during PEA arrest in ED.  Family has opted for CMO and ED requested admission.         Review of systems:   unable to obtain due to pt unresponsive  With Past History of the following :    Past Medical History:  Diagnosis Date  . Cancer [facility])    Prostate  . 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 CYSTOSCOPY W/ URETERAL STENT PLACEMENT Left 11/07/2016   Procedure: CYSTOSCOPY WITH RETROGRADE PYELOGRAM/URETERSCOPY/URETERAL STENT PLACEMENT;  Surgeon: Ardis Hughs, MD;  Location: [facility] ORS;  Service: Urology;  Laterality: Left;      Social History:     Social History   Tobacco Use  . Smoking status: Former Research scientist (life sciences)  . Smokeless tobacco: Never Used  Substance Use Topics  . Alcohol use: No     Lives - at home    Family History :     Family History  Problem Relation Age of Onset  . Hypertension Brother       Home Medications:   Prior to Admission medications   Medication Sig Start Date End Date Taking? Authorizing Provider  acetaminophen (TYLENOL) 325 MG tablet Take 325 mg by mouth 3 (three) times daily.   Yes  [provider]  Calcium Carb-Cholecalciferol (CALCIUM-VITAMIN D) 600-400 MG-UNIT TABS Take 1 tablet by mouth daily.   Yes [provider]  escitalopram (LEXAPRO) 10 MG tablet Take 10 mg by mouth daily.   Yes [provider]  hydrocerin (EUCERIN) CREA Apply 1 application topically 2 (two) times daily as needed.   Yes [provider]  megestrol (MEGACE) 20 MG tablet Take 1 tablet (20 mg total) by mouth 2 (two) times daily. Patient taking differently: Take 20 mg by mouth daily.  11/07/16  Yes Tat, Shanon Brow, MD  morphine (MS CONTIN) 15 MG 12 hr tablet Take 15 mg by mouth every 12 (twelve) hours.   Yes [provider]  ondansetron (ZOFRAN) 8 MG tablet Take 4 mg  by mouth 2 (two) times daily as needed for nausea or vomiting.   Yes [provider]  oxyCODONE (OXY IR/ROXICODONE) 5 MG immediate release tablet Take 5-10 mg by mouth every 4 (four) hours as needed for pain. 09/08/17  Yes [provider]  senna-docusate (SENOKOT-S) 8.6-50 MG tablet Take 2 tablets by mouth 2 (two) times daily.   Yes [provider]  tamsulosin (FLOMAX) 0.4 MG CAPS capsule Take 0.4 mg by mouth at bedtime.   Yes [provider]  ferrous sulfate 325 (65 FE) MG tablet Take 1 tablet (325 mg total) by mouth daily with breakfast. Patient not taking: Reported on 10/27/2017 11/08/16   Orson Eva, MD  HYDROcodone-acetaminophen (NORCO/VICODIN) 5-325 MG tablet Take 1-2 tablets by mouth every 6 (six) hours as needed for moderate pain. Patient not taking: Reported on 11/06/2017 11/07/16   Orson Eva, MD  phenazopyridine (PYRIDIUM) 200 MG tablet Take 1 tablet (200 mg total) by mouth 3 (three) times daily as needed for pain. Patient not taking: Reported on 11/13/2017 11/07/16   Ardis Hughs, MD     Allergies:    No Known Allergies   Physical Exam:   Vitals  Blood pressure (!) 78/58, pulse (!) 127, temperature (!) 95.5 F (35.3 C), temperature source  Temporal, resp. rate (!) 29, SpO2 94 %.   General  lying in bed unresponsive to pain Heent: Pupils 1.48mm symmetric, direct consensual , intact Neck: no jvd Heart: tachy s1, s2 Lung: slight rhonchi Abd soft, nt, nd, +bs Ext: no c/c/e Reflexes 2+ symmetric, diffuse with equivocal toes bilaterally    Data Review:    CBC Recent Labs  Lab 10/26/2017 1211 10/31/2017 1216  WBC 13.8*  --   HGB 9.7* 10.2*  HCT 32.4* 30.0*  PLT 155  --   MCV 93.6  --   MCH 28.0  --   MCHC 29.9*  --   RDW 18.5*  --   LYMPHSABS 6.6*  --   MONOABS 0.6  --   EOSABS 0.1  --   BASOSABS 0.0  --    ------------------------------------------------------------------------------------------------------------------  Chemistries  Recent Labs  Lab 10/28/2017 1211 10/22/2017 1216  NA 138 139  K 4.4 4.3  CL 108 106  CO2 12*  --   GLUCOSE 348* 340*  BUN 18 22*  CREATININE 1.91* 1.70*  CALCIUM 8.7*  --   AST 257*  --   ALT 190*  --   ALKPHOS 46  --   BILITOT 0.5  --    ------------------------------------------------------------------------------------------------------------------ CrCl cannot be calculated (Unknown ideal weight.). ------------------------------------------------------------------------------------------------------------------ No results for input(s): TSH, T4TOTAL, T3FREE, THYROIDAB in the last 72 hours.  Invalid input(s): FREET3  Coagulation profile No results for input(s): INR, PROTIME in the last 168 hours. ------------------------------------------------------------------------------------------------------------------- No results for input(s): DDIMER in the last 72 hours. -------------------------------------------------------------------------------------------------------------------  Cardiac Enzymes No results for input(s): CKMB, TROPONINI, MYOGLOBIN in the last 168 hours.  Invalid input(s):  CK ------------------------------------------------------------------------------------------------------------------ No results found for: BNP   ---------------------------------------------------------------------------------------------------------------  Urinalysis    Component Value Date/Time   COLORURINE STRAW (A) 11/05/2016 1600   APPEARANCEUR CLEAR 11/05/2016 1600   LABSPEC 1.008 11/05/2016 1600   PHURINE 5.0 11/05/2016 1600   GLUCOSEU NEGATIVE 11/05/2016 1600   HGBUR NEGATIVE 11/05/2016 1600   BILIRUBINUR NEGATIVE 11/05/2016 1600   KETONESUR NEGATIVE 11/05/2016 1600   PROTEINUR NEGATIVE 11/05/2016 1600   NITRITE NEGATIVE 11/05/2016 1600   LEUKOCYTESUR NEGATIVE 11/05/2016 1600    ----------------------------------------------------------------------------------------------------------------   Imaging Results:  Dg Chest Portable 1 View  Result Date: 10/20/2017 CLINICAL DATA:  Post CPR, ETT placement, history of prostate cancer EXAM: PORTABLE CHEST 1 VIEW COMPARISON:  None. FINDINGS: Endotracheal tube terminates 4.5 cm above the carina. Enteric tube courses into the stomach. Lungs are clear.  No pleural effusion or pneumothorax. Defibrillator pads overlying the right hemithorax and lateral left lower hemithorax. IMPRESSION: Endotracheal tube terminates 4.5 cm above the carina. Enteric tube courses into the stomach. Electronically Signed   By: Julian Hy M.D.   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  Code Status DNR  Likely DC to  TBD  Condition Critical  Consults called: none  Admission status: inpatient   Time spent in minutes : 45   Jani Gravel M.D on 11/07/2017 at 6:06 PM  Between 7am to 7pm - Pager - [phone]  . After 7pm go to [email or web address] - password [facility]  [facility] - Office  [phone]

## 2017-11-14 NOTE — Procedures (Signed)
Extubation Procedure Note  Patient Details:   Name: Kyle Randall DOB: May 19, 1951 MRN: [id number]   Airway Documentation:  Airway 7 mm (Active)  Secured at (cm) 27 cm 10/21/2017 12:23 PM  Measured From Lips 10/31/2017 12:23 PM  [place] 11/16/2017 12:23 PM  Secured By [employer] 10/18/2017 12:23 PM  Site Condition Dry 10/25/2017 12:23 PM    Evaluation  O2 sats: stable throughout Complications: No apparent complications Patient did tolerate procedure well.     No   Patient extubated per order to [address]L [address]. Patient is now a DNR code status. RT will continue to monitor.   Jarone Ostergaard Clyda Greener 10/28/2017, 2:18 PM

## 2017-11-14 NOTE — ED Triage Notes (Signed)
Pt presents via [facility] for evaluation of cardiac arrest today. Pt from home with abd pain, emesis and diarrhea this AM. Per EMS pt HR decreased and then arrested. Pt had ROSC x 3, restarted CPR on arrival to ED at 1200. Initially began CPR 1110.  Received 8 epi in route.

## 2017-11-14 NOTE — ED Notes (Addendum)
Spoke with respiratory for extubation, family updated

## 2017-11-14 NOTE — ED Notes (Signed)
Report attempted 

## 2017-11-14 NOTE — ED Notes (Signed)
Family denies any needs at this time.

## 2017-11-14 NOTE — ED Provider Notes (Signed)
Emergency Department Provider Note   I have reviewed the triage vital signs and the nursing notes.   HISTORY  Chief Complaint Cardiac Arrest   HPI Kyle Randall is a 66 y.o. male history of prostate cancer no other known medical problems who presents emergency department today secondary to cardiac arrest.  Patient apparently has been in and out of PEA arrest and had [place] multiple times with both doses of epinephrine over the last hour.  He been complaints of nausea and vomiting and epigastric pain for the last 24 hours and when EMS was called his when he coded.  No other history obtained.  LEVEL V CAVEAT APPLIES SECONDARY TO unresponsive   Past Medical History:  Diagnosis Date  . Cancer [facility])    Prostate  . 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 Hydronephrosis of left kidney   . Uncontrolled pain 11/06/2016  . AKI (acute kidney injury) ([place])   . Kidney disease 11/05/2016  . Dehydration 11/05/2016  . 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 Prostate cancer metastatic to intrapelvic lymph node ([place]) 11/05/2016  . Normocytic anemia 11/05/2016  . Thrombocytopenia ([place]) 11/05/2016  . Low back pain   . Outbursts of anger 02/16/2012  . Gout 02/16/2012    Past Surgical History:  Procedure Laterality Date  . CYSTOSCOPY W/ URETERAL STENT PLACEMENT Left 11/07/2016   Procedure: CYSTOSCOPY WITH RETROGRADE PYELOGRAM/URETERSCOPY/URETERAL STENT PLACEMENT;  Surgeon: Ardis Hughs, MD;  Location: [facility];  Service: Urology;  Laterality: Left;    Current Outpatient Rx  . Order #: [id number] Class: Historical Med  . Order #: [id number] Class: OTC  . Order #: [id number] Class: Historical Med  . Order #: [id number] Class: Print  . Order #: [id number] Class: Normal  . Order #: [id number] Class: Print  . Order #: [id number] Class: Historical Med  . Order #: [id number] Class: Historical Med  . Order  #: [id number] Class: Historical Med    Allergies Patient has no known allergies.  Family History  Problem Relation Age of Onset  . Hypertension Brother     Social History Social History   Tobacco Use  . Smoking status: Former Research scientist (life sciences)  . Smokeless tobacco: Never Used  Substance Use Topics  . Alcohol use: No  . Drug use: Yes    Types: Marijuana    Comment: daily    Review of Systems  LEVEL V CAVEAT APPLIES SECONDARY TO unresponsive ____________________________________________  PHYSICAL EXAM:  VITAL SIGNS: ED Triage Vitals  Enc Vitals Group     BP 10/18/2017 1204 (!) 129/45     Pulse Rate 11/05/2017 1223 (!) 25     Resp 11/07/2017 1206 (!) 94     Temp 10/25/2017 1240 (!) 95.5 F (35.3 C)     Temp Source 10/24/2017 1240 Temporal     SpO2 11/11/2017 1223 96 %    Constitutional: Well nourished. Eyes: Conjunctivae are normal.  Equal and reactive pupils.  Head: Atraumatic. Nose: No congestion/rhinnorhea. Mouth/Throat: Mucous membranes are moist.  Oropharynx non-erythematous. Neck: No stridor.  No meningeal signs.   Cardiovascular: Pulseless Respiratory: No respiratory effort.  Gastrointestinal: Soft and nontender. No distention.  Stents of mottling on the abdomen wall Musculoskeletal: No lower extremity tenderness nor edema. No gross deformities of extremities. Neurologic:  Not able to assess 2/2 condition.  Skin:   No rash noted. Psych: Not able to assess 2/2 condition.   ____________________________________________   LABS (all labs ordered are  listed, but only abnormal results are displayed)  Labs Reviewed  CBC WITH DIFFERENTIAL/PLATELET - Abnormal; Notable for the following components:      Result Value   WBC 13.8 (*)    RBC 3.46 (*)    Hemoglobin 9.7 (*)    HCT 32.4 (*)    MCHC 29.9 (*)    RDW 18.5 (*)    Lymphs Abs 6.6 (*)    All other components within normal limits  COMPREHENSIVE METABOLIC PANEL - Abnormal; Notable for the following components:   CO2 12 (*)      Glucose, Bld 348 (*)    Creatinine, Ser 1.91 (*)    Calcium 8.7 (*)    Total Protein 5.1 (*)    Albumin 2.3 (*)    AST 257 (*)    ALT 190 (*)    GFR calc non Af Amer 35 (*)    GFR calc Af Amer 40 (*)    Anion gap 18 (*)    All other components within normal limits  I-STAT CHEM 8, ED - Abnormal; Notable for the following components:   BUN 22 (*)    Creatinine, Ser 1.70 (*)    Glucose, Bld 340 (*)    Calcium, Ion 1.13 (*)    TCO2 16 (*)    Hemoglobin 10.2 (*)    HCT 30.0 (*)    All other components within normal limits  I-STAT TROPONIN, ED - Abnormal; Notable for the following components:   Troponin i, poc 2.51 (*)    All other components within normal limits  I-STAT CG4 LACTIC ACID, ED - Abnormal; Notable for the following components:   Lactic Acid, Venous 14.57 (*)    All other components within normal limits   ____________________________________________  EKG   EKG Interpretation  Date/Time:  Friday November 14 2017 12:11:57 EST Ventricular Rate:  105 PR Interval:    QRS Duration: 125 QT Interval:  390 QTC Calculation: 501 R Axis:   103 Text Interpretation:  Atrial fibrillation RBBB and LPFB ST depr, consider ischemia, anterior leads Borderline ST elevation Baseline wander in lead(s) V3 Confirmed by Merrily Pew [phone]) on 11/01/2017 4:13:45 PM     ' ____________________________________________  RADIOLOGY  Dg Chest Portable 1 View  Result Date: 11/17/2017 CLINICAL DATA:  Post CPR, ETT placement, history of prostate cancer EXAM: PORTABLE CHEST 1 VIEW COMPARISON:  None. FINDINGS: Endotracheal tube terminates 4.5 cm above the carina. Enteric tube courses into the stomach. Lungs are clear.  No pleural effusion or pneumothorax. Defibrillator pads overlying the right hemithorax and lateral left lower hemithorax. IMPRESSION: Endotracheal tube terminates 4.5 cm above the carina. Enteric tube courses into the stomach. Electronically Signed   By: Julian Hy M.D.    On: 10/24/2017 12:35   ____________________________________________   PROCEDURES  Procedure(s) performed:   Procedures   CRITICAL CARE Performed by: Merrily Pew Total critical care time: 65 minutes Critical care time was exclusive of separately billable procedures and treating other patients. Critical care was necessary to treat or prevent imminent or life-threatening deterioration. Critical care was time spent personally by me on the following activities: development of treatment plan with patient and/or surrogate as well as nursing, discussions with consultants, evaluation of patient's response to treatment, examination of patient, obtaining history from patient or surrogate, ordering and performing treatments and interventions, ordering and review of laboratory studies, ordering and review of radiographic studies, pulse oximetry and re-evaluation of patient's condition.  Cardiopulmonary Resuscitation (CPR) Procedure Note Directed/Performed by: Dayna Barker,  Corene Cornea I personally directed ancillary staff and/or performed CPR in an effort to regain return of spontaneous circulation and to maintain cardiac, neuro and systemic perfusion.   ____________________________________________   INITIAL IMPRESSION / ASSESSMENT AND PLAN / ED COURSE  Pertinent labs & imaging results that were available during my care of the patient were reviewed by me and considered in my medical decision making (see chart for details).  66 year old male with cardiac arrest.  Multiple arrests here and started on levo fed, epinephrine drips.  Bedside ultrasound showed likely dilated right ventricle.  With his history of cancer and quick deterioration concern for ACS versus pulmonary embolus is the likely cause.  TPA given without any significant improvement in vital signs.  Multiple discussions with with the patient's longtime girlfriend, Aram Beecham and his brothers, Deidre Ala and Fritz Pickerel was decided the patient would not want to be  on a breathing machine nor would he want to be on medicines to prolong his life and he deftly would not want to be on any of these and in a skilled nursing facility.  It was decided to make the patient comfort care.  As the patient is expected to die relatively quickly we will observe him in the emergency department until that happens.  ____________________________________________  FINAL CLINICAL IMPRESSION(S) / ED DIAGNOSES  Final diagnoses:  Cardiac arrest ([place])  Respiratory arrest ([place])    MEDICATIONS GIVEN DURING THIS VISIT:  Medications  norepinephrine (LEVOPHED) 4 mg in dextrose 5 % 250 mL (0.016 mg/mL) infusion (0 mcg/min Intravenous Stopped 11/09/2017 1342)  fentaNYL (SUBLIMAZE) injection 25 mcg (25 mcg Intravenous Given 11/12/2017 1543)  LORazepam (ATIVAN) tablet 1 mg ( Oral See Alternative 10/28/2017 1545)    Or  LORazepam (ATIVAN) 2 MG/ML concentrated solution 1 mg ( Sublingual See Alternative 10/31/2017 1545)    Or  LORazepam (ATIVAN) injection 1 mg (1 mg Intravenous Given 11/03/2017 1545)  EPINEPHrine (ADRENALIN) 1 MG/10ML injection (1 mg Intravenous Given 10/24/2017 1205)  calcium chloride injection (1 g Intravenous Given 10/25/2017 1207)  tenecteplase (TNKASE) injection 35 mg (35 mg Intravenous Given 11/02/2017 1213)  atropine injection (1 mg Intravenous Given 10/18/2017 1222)  EPINEPHrine (ADRENALIN) 1 MG/10ML injection (1 mg Intravenous Given 10/30/2017 1228)     Chizaram Latino, Corene Cornea, MD 10/25/2017 1621

## 2017-11-14 NOTE — Progress Notes (Signed)
1820 Received pt from ED via stretcher. Pt is unresponsive, moans when moved. Breathing not labored. Family at the bedside.

## 2017-11-18 DEATH — deceased

## 2017-12-19 NOTE — Discharge Summary (Signed)
Kyle Randall, is a 67 y.o. male  DOB 06-Apr-1951  MRN [id number].  Admission date:  10/19/2017  Admitting Physician  Jani Gravel, MD  Discharge Date:  12/08/2017   Primary MD  Leandrew Koyanagi, MD (Inactive)  Admission Diagnosis  Cardiac arrest [facility]) [I46.9] Respiratory arrest [facility]) [R09.2]   Discharge Diagnosis  Cardiac arrest [facility]) [I46.9] Respiratory arrest ([place]) [R09.2]    Principal Problem:   Cardiac arrest [facility]) Active Problems:   Prostate cancer metastatic to intrapelvic lymph node ([place])   Hypotension      Past Medical History:  Diagnosis Date  . Cancer [facility])    Prostate  . Prostate cancer, primary, with metastasis from prostate to other site [facility])     Past Surgical History:  Procedure Laterality Date  . CYSTOSCOPY W/ URETERAL STENT PLACEMENT Left 11/07/2016   Procedure: CYSTOSCOPY WITH RETROGRADE PYELOGRAM/URETERSCOPY/URETERAL STENT PLACEMENT;  Surgeon: Ardis Hughs, MD;  Location: [facility];  Service: Urology;  Laterality: Left;       HPI  from the history and physical done on the day of admission:     67 y.o. male, w metastatic prostate cancer who apparently was found down by friend around 11am.  Pt was in cardiac arrest and received 8 epi en route and lost pulse upon arrival to ED.  CPR initiated in ED and pt coded for >60 minutes.  Pt received TPA for ? PE during PEA arrest in ED.  Family has opted for CMO and ED requested admission.           Hospital Course:     Pt was admitted for PEA arrest in ED.  Pt family opted for CMO  Pt w known metastatic prostate cancer passed comfortably likely from ? PE vs AMI     Dg Chest Portable 1 View  Result Date: 10/31/2017 CLINICAL DATA:  Post CPR, ETT placement, history of prostate cancer EXAM: PORTABLE CHEST 1 VIEW COMPARISON:  None. FINDINGS: Endotracheal tube terminates 4.5 cm above the carina. Enteric tube courses  into the stomach. Lungs are clear.  No pleural effusion or pneumothorax. Defibrillator pads overlying the right hemithorax and lateral left lower hemithorax. IMPRESSION: Endotracheal tube terminates 4.5 cm above the carina. Enteric tube courses into the stomach. Electronically Signed   By: Julian Hy M.D.   On: 10/22/2017 12:35    Micro Results    No results found for this or any previous visit (from the past 240 hour(s)).      Data Review   CBC w Diff:  Lab Results  Component Value Date   WBC 13.8 (H) 11/17/2017   HGB 10.2 (L) 10/31/2017   HCT 30.0 (L) 11/02/2017   PLT 155 10/28/2017   LYMPHOPCT 48 10/20/2017   MONOPCT 4 11/09/2017   EOSPCT 1 11/05/2017   BASOPCT 0 11/04/2017    CMP:  Lab Results  Component Value Date   NA 139 11/05/2017   K 4.3 10/25/2017   CL 106 11/07/2017   CO2 12 (L)  10/30/2017   BUN 22 (H) 11/10/2017   CREATININE 1.70 (H) 11/11/2017   PROT 5.1 (L) 10/27/2017   ALBUMIN 2.3 (L) 10/30/2017   BILITOT 0.5 11/11/2017   ALKPHOS 46 11/13/2017   AST 257 (H) 11/11/2017   ALT 190 (H) 11/13/2017  .   Total Time in preparing paper work, data evaluation and todays exam - 30 minutes  Jani Gravel M.D on 12/08/2017 at 6:47 PM  [facility]   Office  [phone]

## 2018-06-06 IMAGING — DX DG CHEST 1V PORT
1 series · 1 of 1 positions shown · non-contrast
Comparison: None.

CLINICAL DATA: Post CPR, ETT placement, history of prostate cancer

EXAM:
PORTABLE CHEST 1 VIEW

[chest ap]
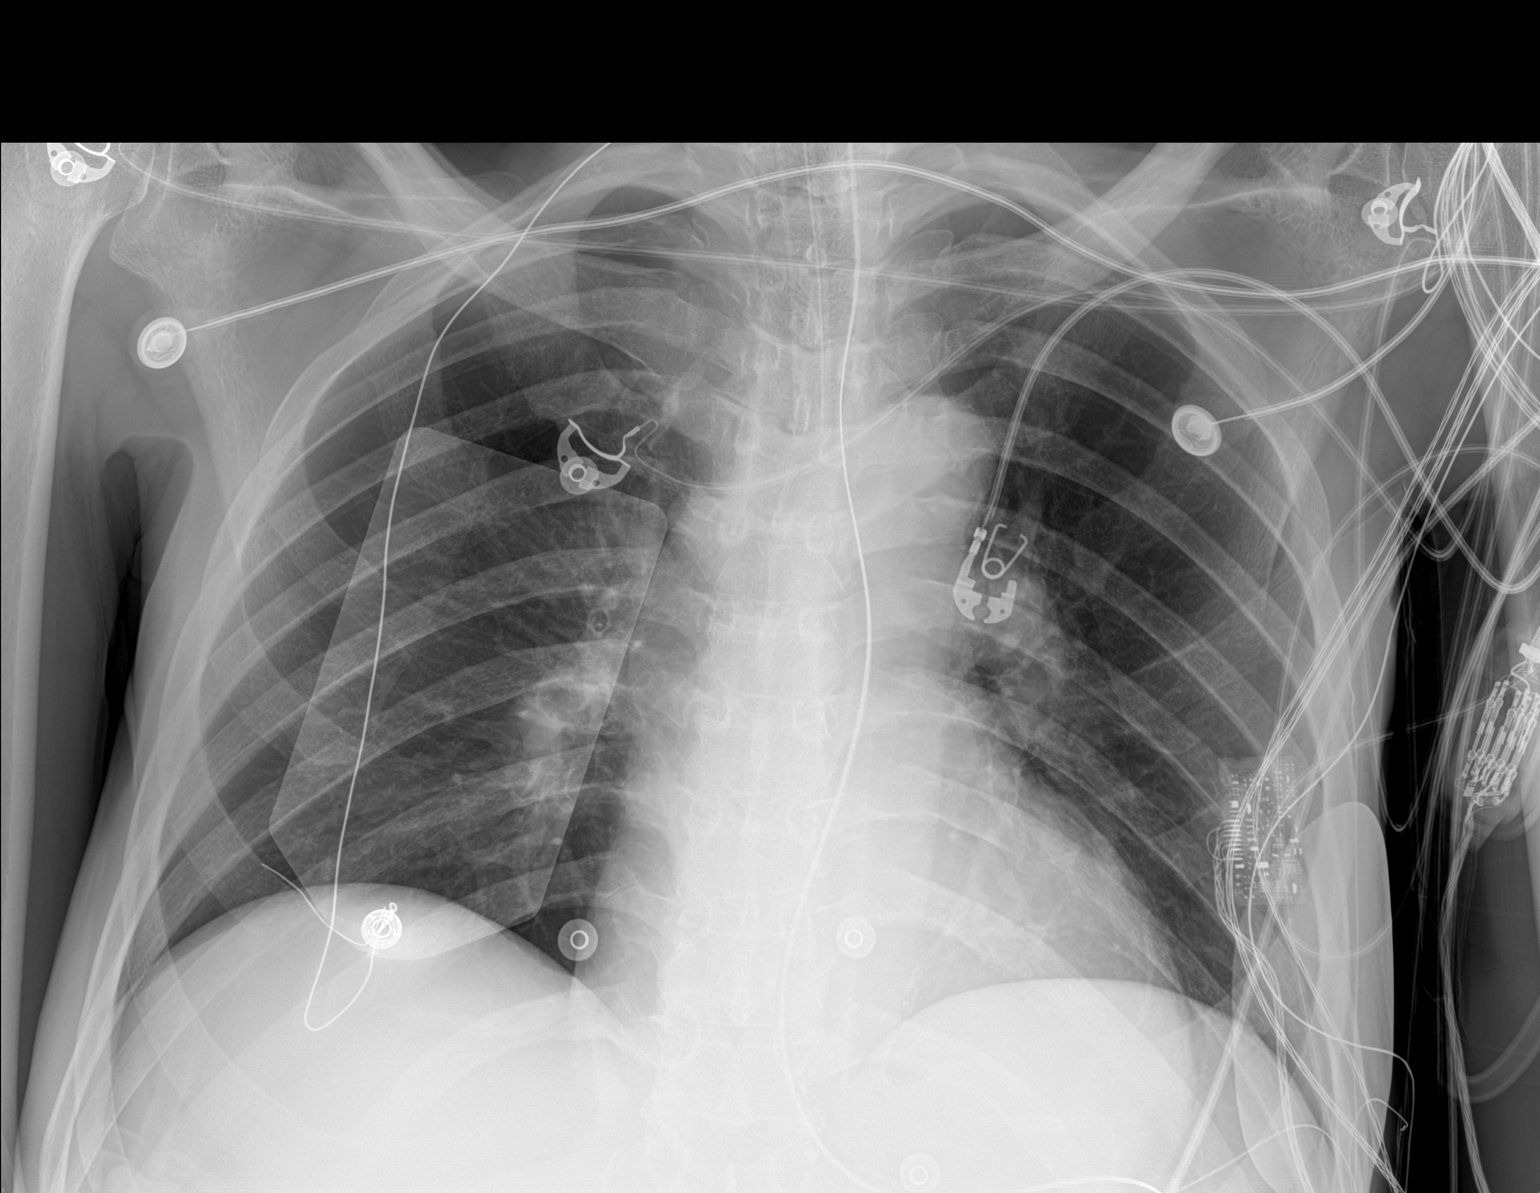

[1 of 1 positions shown; findings below may reference images not displayed]

FINDINGS: Endotracheal tube terminates 4.5 cm above the carina.

Enteric tube courses into the stomach.

Lungs are clear.  No pleural effusion or pneumothorax.

Defibrillator pads overlying the right hemithorax and lateral left
lower hemithorax.
IMPRESSION: Endotracheal tube terminates 4.5 cm above the carina.

Enteric tube courses into the stomach.
